# Patient Record
Sex: Male | Born: 1950
Health system: Southern US, Community
[De-identification: ages and names within clinical notes are randomized; demographics above are authoritative.]

## PROBLEM LIST (undated history)

## (undated) DIAGNOSIS — I700 Atherosclerosis of aorta: Secondary | ICD-10-CM

## (undated) DIAGNOSIS — I4719 Other supraventricular tachycardia: Secondary | ICD-10-CM

## (undated) DIAGNOSIS — I471 Supraventricular tachycardia: Secondary | ICD-10-CM

## (undated) HISTORY — PX: BACK SURGERY: SHX140

## (undated) HISTORY — DX: Other supraventricular tachycardia: I47.19

## (undated) HISTORY — PX: KNEE SURGERY: SHX244

## (undated) HISTORY — DX: Atherosclerosis of aorta: I70.0

## (undated) HISTORY — DX: Supraventricular tachycardia: I47.1

---

## 2003-11-16 ENCOUNTER — Ambulatory Visit (HOSPITAL_COMMUNITY): Admission: RE | Admit: 2003-11-16 | Discharge: 2003-11-16 | Payer: Self-pay | Admitting: Orthopaedic Surgery

## 2003-11-30 ENCOUNTER — Ambulatory Visit (HOSPITAL_COMMUNITY): Admission: RE | Admit: 2003-11-30 | Discharge: 2003-11-30 | Payer: Self-pay | Admitting: Orthopaedic Surgery

## 2004-03-14 ENCOUNTER — Ambulatory Visit (HOSPITAL_COMMUNITY): Admission: RE | Admit: 2004-03-14 | Discharge: 2004-03-14 | Payer: Self-pay | Admitting: Orthopaedic Surgery

## 2004-03-22 ENCOUNTER — Ambulatory Visit (HOSPITAL_COMMUNITY): Admission: RE | Admit: 2004-03-22 | Discharge: 2004-03-23 | Payer: Self-pay | Admitting: Neurosurgery

## 2007-03-07 ENCOUNTER — Ambulatory Visit (HOSPITAL_COMMUNITY): Admission: RE | Admit: 2007-03-07 | Discharge: 2007-03-07 | Payer: Self-pay | Admitting: Orthopaedic Surgery

## 2007-03-18 ENCOUNTER — Ambulatory Visit (HOSPITAL_COMMUNITY): Admission: RE | Admit: 2007-03-18 | Discharge: 2007-03-18 | Payer: Self-pay | Admitting: Orthopaedic Surgery

## 2011-03-06 NOTE — H&P (Signed)
NAME:  Brandon Allison, CRUM              ACCOUNT NO.:  [id number]   MEDICAL RECORD NO.:  [id number]          PATIENT TYPE:  AMB   LOCATION:  DAY                           FACILITY:  [facility]   PHYSICIAN:  J. Darreld Mclean, M.D. DATE OF BIRTH:  [birth year]   DATE OF ADMISSION:  03/18/2007  DATE OF DISCHARGE:  LH                              HISTORY & PHYSICAL   CHIEF COMPLAINT:  Right knee pain.   The patient is 60 years old and complains of pain and tenderness in his  right knee.  The pain and tenderness for several weeks are getting  progressively worse.  It is giving way, swelling and occasional feeling  that it is going to lock but it has not locked up.  He says the knee  just does not feel right.  I did a left knee arthroscopy on him in  February 2005, and he has done well since then.  He says his right knee  feels like the left knee did before he had surgery.  I saw him in the  office on May 15.  I thought he had a tear of the medial meniscus,  perhaps lateral meniscus.  He underwent an MRI of the knee on May 16 at  the hospital.  MRI shows tear of the anterior horn and body of the  lateral meniscus associated with a lateral parameniscal cyst.  He also  has a peripheral irregular tear of the posterior horn of the medial  meniscus with centrally displaced medial meniscal fragment. There are no  ligamentous injuries.  He has a joint effusion.   I discussed with the patient the findings, and I saw him back in the  office on May 23.  He wants to go ahead and have surgery on the knee as  soon as possible.  Tuesday, May 27, is the first available date.  He  wants to go ahead and take that.  He understands the procedure having  had an arthroscopy on the left knee.  I did go over risks and  imponderables with him.   PAST HISTORY:  Positive for back pain.  He has no allergies.  He is  currently taking Zetia 10 mg daily, aspirin 81 mg daily and vitamin C.  He does not smoke or use alcohol.   PREVIOUS SURGERIES:  1. Left knee arthroscopy February 2005.  2. Back surgery 2005 in June.   Dr. Charm Barges is his family doctor.  He lives in [place], and he is  married.   PHYSICAL EXAMINATION:  Blood pressure is 126/78, pulse 78, respirations  16, afebrile, 5 feet 4 3/4 inches, 153 pounds.  He is alert and  cooperative.  HEENT:  Negative.  Neck:  Supple.  Lungs:  Clear to  percussion and auscultation.  Heart:  Regular without murmur heard.  Abdomen:  Soft, nontender without masses.  His right knee is slightly  effused.  He has got pain and tenderness in the medial joint line and  positive McMurray medially, weakly laterally.  Drawer sign is negative.  Lachman negative.  Extremities:  Normal limits.  Central  nervous system:  Intact.  Skin:  Intact.   IMPRESSION:  1. Tear of the medial meniscus of the right knee and a tear in the      anterior horn and body of the lateral meniscus, right knee.  2. Status post left knee arthroscopy and status post lower back      surgery.   PLAN:  Operative for arthroscopy, medial and lateral meniscectomy,  partial.  I discussed with the patient the plan and procedure, risk and  imponderables.  His labs are pending.                                            ______________________________  J. Darreld Mclean, M.D.     JWK/MEDQ  D:  03/17/2007  T:  03/17/2007  Job:  [id number]

## 2011-03-06 NOTE — Op Note (Signed)
NAME:  Brandon Allison, Brandon Allison              ACCOUNT NO.:  [id number]   MEDICAL RECORD NO.:  [id number]          PATIENT TYPE:  AMB   LOCATION:  DAY                           FACILITY:  [facility]   PHYSICIAN:  J. Darreld Mclean, M.D. DATE OF BIRTH:  Feb 25, 1951   DATE OF PROCEDURE:  DATE OF DISCHARGE:                               OPERATIVE REPORT   PREOPERATIVE DIAGNOSIS:  Tear medial and lateral meniscus of the right  knee.   POSTOPERATIVE DIAGNOSIS:  Tear medial and lateral meniscus of the right  knee.   OPERATION:  1. Operative arthroscopy of the right knee.  2. Partial medial and lateral meniscectomies.  3. Holmium laser.   ANESTHESIA:  General.   TOURNIQUET TIME:  Please refer to anesthesia record.   DRAINS:  No drains.   JUSTIFICATION FOR PROCEDURE:  The patient complains of pain and  tenderness of his right knee, swelling, giving away, and a couple of  episodes of locking.  He had a previous arthroscopy of his left knee  several years ago.  He says the right knee feels now like the left knee  did before his surgery.  An MRI of the knee was done, which showed a  tear of the medial and lateral meniscus of the knee.  The ligaments were  intact.  Surgery was recommended.  The patient understands the risks and  imponderables of the procedure.  I have discussed these with him plus he  has undergone the procedure on the other side several years ago.   DESCRIPTION OF PROCEDURE:  The patient was seen in the holding area.  The right knee was then advised correct surgical site.  I placed a mark  on the right knee.   The patient was brought back to the operating room and placed supine on  the operating room table.  He was given general anesthesia.  The  tourniquet was placed deflated on the right upper thigh, and a leg  holder was placed on the right upper thigh.  He was prepped and draped  in the usual manner.  We had a timeout.  We identified the above-  mentioned patient as the patient  and the right knee as the correct  surgical site.  His leg was elevated and wrapped circumferentially with  an Esmarch bandage.  The tourniquet was inflated to 250 mmHg.  The  Esmarch bandage was removed.  Inflow cannula was inserted medially.  Lactated Ringer's filled the knee by an infusion pump.   Arthroscope was inserted laterally, and the knee was systematically  examined.  The suprapatellar pouch looked negative.  Medially, there was  a tear on the posterior horn of the medial meniscus with a defect in the  articular surface of the femoral condyle.  The anterior cruciate was  intact.  Laterally, there was a small tear of the anterior horn and  slightly going around the corner to the posterior horn.  The articular  surfaces had grade 2 changes.  No loose bodies were present.   Using the meniscal punch, meniscal shaver and the laser, a good smooth  contour was  obtained.  The largest segment of the cartilage was removed  manually after cutting with the laser.  A good smooth contour was  obtained.  The defect on the femoral condyle had the laser used over  this.  Laterally, the meniscus had a small tear anterior, and this was  removed with the meniscal shaver, and then the small tear on the lateral  edge of the posterior horn was debrided with the holmium laser.  A good  smooth contour was obtained.  Pictures were taken throughout the case.  The knee was systematically reexamined, and no pathology was found.  The  wound was irrigated with the remaining part of the lactated Ringer's.  The wound was reapproximated with 3-0 nylon interrupted vertical  mattress manner.  Marcaine 0.25% was instilled in each portal.  The  tourniquet was deflated.  Please refer to the anesthesia record for the  tourniquet time.  A sterile dressing was applied.  A bulky dressing was  applied.  A knee immobilizer was applied.  The patient tolerated the  procedure well.   PLAN:  I will see him in the office in  approximately 10 days to two  weeks.  The patient has been arranged.  A prescription was given for  Vicodin ES for pain.  If there is any difficulty, he is to contact me  through the office or hospital beeper system.  The numbers have been  provided.           ______________________________  Shela Commons. Darreld Mclean, M.D.     JWK/MEDQ  D:  03/18/2007  T:  03/18/2007  Job:  [id number]

## 2011-03-09 NOTE — Op Note (Signed)
NAME:  Brandon Allison, Brandon Allison                        ACCOUNT NO.:  [id number]   MEDICAL RECORD NO.:  [id number]                   PATIENT TYPE:  AMB   LOCATION:  DAY                                  FACILITY:  [facility]   PHYSICIAN:  J. Darreld Mclean, M.D.              DATE OF BIRTH:  [birth year]   DATE OF PROCEDURE:  DATE OF DISCHARGE:                                 OPERATIVE REPORT   PREOPERATIVE DIAGNOSES:  Left knee bucket handle tear medially of the  meniscus and small tear and fraying of the lateral meniscus.   POSTOPERATIVE DIAGNOSES:  Left knee bucket handle tear medially of the  meniscus and small tear and fraying of the lateral meniscus.   PROCEDURE:  Operative arthroscopy left knee, removal of bucket handle tear  partial medial meniscectomy, debridement and fraying of the lateral  meniscus.   ANESTHESIA:  General.   TOURNIQUET TIME:  18 minutes.   DRAINS:  None.   SURGEON:  J. Darreld Mclean, M.D.   ASSISTANT:  Candace Cruise, P.A.   INDICATIONS FOR PROCEDURE:  The patient is a 60 year old male with pain and  tenderness in the knees since the first of this new year.  MRI shows a  bucket handle tear of the left knee medially and a small tear of the  anterior meniscus laterally. The patient has not improved with conservative  treatment, surgery is recommended.   FINDINGS:  The suprapatellar pouch looked normal and the suprapatellar  looked normal laterally. There was a small fraying ear anteriorly. The  anterior cruciate was intact, medially there was a large bucket handle tear  with the bucket handle portion moved forward. Some slight grade 2-3 changes  medially on the surface.  No loose bodies.   DESCRIPTION OF PROCEDURE:  The patient was placed supine on the operating  room table and general anesthesia was obtained.  Leg holder placed and  tourniquet placed in the left upper thigh. The patient prepped and draped in  the usual manner. We ascertained we were doing Mr.  Allison, doing his left  knee. The tourniquet was used but first we used an Esmarch bandage to  exsanguinate the extremity, tourniquet inflated to 300 mmHg.  Esmarch  bandage removed.  Inflow cannula inserted medially, lactated Ringer's  instilled in the knee by an infusion pump.  Arthroscope inserted laterally,  knee systematically examined, please see findings above. Permanent pictures  taken throughout the case.  The medial meniscus was approached first using a  hook knife and then the scissors.  A large portion of the bucket handle tear  was removed and the remaining portion was removed and submitted with a  meniscal shaver and a meniscal punch.  Good contour was obtained. Laterally  using a small meniscal shaver, the frayed portion of the torn and the  anterior portion of the lateral meniscus was debrided and removed and the  other frayed portion was submitted  back to a clean smooth edge.  The knee  was systematically reexamined and no pathology found.  The wound was  irrigated with the remaining part of the lactated Ringer's, 3-0 nylon used  to close each wound in interrupted vertical mattress manner.  Marcaine 0.25%  used in each portal, tourniquet deflated after 18 minutes, sterile dressing  applied, bulky dressing applied, knee immobilizer applied. The patient went  to recovery in good condition. He will be seen in the office for physical  therapy later this week and I will see him in approximately 10 days to 2  weeks for suture removal.  If there are any difficulties contact me through  the office or hospital beeper system.  Vicodin ES given for pain.      ___________________________________________                                            Teola Bradley, M.D.   JWK/MEDQ  D:  11/30/2003  T:  11/30/2003  Job:  [id number]

## 2011-03-09 NOTE — H&P (Signed)
NAME:  Brandon Allison, Brandon Allison                        ACCOUNT NO.:  [id number]   MEDICAL RECORD NO.:  [id number]                   PATIENT TYPE:  AMB   LOCATION:  DAY                                  FACILITY:  [facility]   PHYSICIAN:  J. Darreld Mclean, M.D.              DATE OF BIRTH:  04-14-1951   DATE OF ADMISSION:  DATE OF DISCHARGE:                                HISTORY & PHYSICAL   CHIEF COMPLAINT:  1. Left-knee pain.  2. Torn meniscus.   HISTORY OF PRESENT ILLNESS:  The patient is a 60 year old male with pain and  tenderness in his left knee.  He started having pain around the first of the  year.  He denies any falls or trauma.  It has gotten progressively worse.  I  saw him in the office January 18, and recommended MRI.  MRI was done at the  hospital on January 25 and showed horizontal tear anterior horn and lateral  meniscus with large bucket-handle tear of the medial meniscus.  The  patient's knee started giving away, having pain, swelling and tenderness.  He has been on antiinflammatory Naprosyn without any help.  The patient  desires surgery at this time.   The risk and imponderables have been discussed with the patient who appears  to understand and agrees to the procedure as stated.   PAST MEDICAL HISTORY:  The patient has had problems with his back in the  past.  Otherwise negative review of systems except for a torn Achilles  tendon.  He has had a vasectomy and a circumcision.   CURRENT MEDICATIONS:  1. Lipitor 10 mg once a day.  2. Aspirin 81 mg a day.   SOCIAL HISTORY:  He does not use alcoholic beverage, and he does not smoke.  He is a Engineer, agricultural.  The patient is married and lives in  [place].   FAMILY PHYSICIAN:  Dr. Charm Barges is his family doctor.   PHYSICAL EXAMINATION:  VITAL SIGNS:  The patient is 5 feet, 6 inches and  weighs 162 pounds.  Blood pressure is 120/72, pulse 60, respirations 12,  afebrile.  HEENT:  Negative.  He is alert, cooperative and  oriented.  NECK:  Supple.  LUNGS:  Clear to percussion and auscultation.  HEART:  Regular without murmur heard.  ABDOMEN:  Soft, nontender without masses.  EXTREMITIES:  The left knee has got effusion.  Other extremities are within  normal limits.  SKIN:  Intact.   IMPRESSION:  1. Tear of lateral meniscus, left knee, with bucket handle tear medially.  2. Tear anterior portion of the lateral meniscus.   PLAN:  Operative arthroscopy wrist and imponderables were discussed with the  patient preoperative, and he appears to understand and agrees with the  procedure.  Labs are pending.     ___________________________________________  Teola Bradley, M.D.   JWK/MEDQ  D:  11/30/2003  T:  11/30/2003  Job:  [id number]

## 2011-03-09 NOTE — Op Note (Signed)
NAME:  Brandon Allison, Brandon Allison NO.:  [id number]   MEDICAL RECORD NO.:  [id number]                   PATIENT TYPE:  OIB   LOCATION:  3001                                 FACILITY:  [facility]   PHYSICIAN:  Coletta Memos, M.D.                  DATE OF BIRTH:  Jul 21, 1951   DATE OF PROCEDURE:  03/22/2004  DATE OF DISCHARGE:                                 OPERATIVE REPORT   PREOPERATIVE DIAGNOSIS:  1. Displaced disc L4-L5, left.  2. Left L5 radiculopathy.   POSTOPERATIVE DIAGNOSIS:  1. Displaced disc L4-L5, left.  2. Left L5 radiculopathy.   PROCEDURE:  Left L4-L5 semi-hemilaminectomy and discectomy with  microdissection.   COMPLICATIONS:  None.   SURGEON:  Coletta Memos, M.D.   ANESTHESIA:  General endotracheal anesthesia.   INDICATIONS FOR PROCEDURE:  Jamas Jaquay presented with severe pain in the  left lower extremity he has had now for a number of weeks.  The pain did not  resolve.  He has an MRI which showed a large disc herniation at L4-L5 with a  piece of disc that had migrated caudally.  I, therefore, recommended and he  agreed to undergo operative decompression.   OPERATIVE NOTE:  Mr. Rochford was brought to the operating room, intubated,  and placed under general anesthesia without difficulty.  He was rolled prone  onto a Wilson frame and all pressure points were properly padded.  His skin  was prepped and he was draped in a sterile fashion.  I infiltrated 10 mL of  0.5% lidocaine with 1:200,000 epinephrine into the lumbar region.  I then  opened the skin with a #10 blade and took this down to the thoracolumbar  fascia.  I exposed the lamina of L3, L4, and L5.  I took an x-ray and this  showed that I was at the L3-L4 interlaminar space, so I then move down one  level.  I used Kerrison punches and created a semi-hemilaminectomy.  Once  that was done, I removed the thecal sac and removed the ligamentum flavum,  therefore, exposing the thecal sac.   Using the microscope, I brought that  into the operative field.  I retracted the thecal sac medially and  encountered what was a large disc herniation caudal to the disc space.  I  was able to remove that without difficulty and then I opened the disc space.  I used curets and pituitary rongeurs and took out enough of the disc to  satisfy myself that there were no free pieces remaining within the disc  space and that the nerve root was completely decompressed.  I then irrigated  the wound.  I then closed the wound in layered fashion using Vicryl sutures.  Dermabond was used for a sterile dressing.  The patient tolerated the  procedure well.  Coletta Memos, M.D.    KC/MEDQ  D:  03/22/2004  T:  03/22/2004  Job:  [id number]

## 2016-11-13 ENCOUNTER — Ambulatory Visit (INDEPENDENT_AMBULATORY_CARE_PROVIDER_SITE_OTHER): Payer: BC Managed Care – PPO

## 2016-11-13 ENCOUNTER — Ambulatory Visit (INDEPENDENT_AMBULATORY_CARE_PROVIDER_SITE_OTHER): Payer: BC Managed Care – PPO | Admitting: Orthopaedic Surgery

## 2016-11-13 ENCOUNTER — Encounter: Payer: Self-pay | Admitting: Orthopaedic Surgery

## 2016-11-13 VITALS — BP 138/70 | HR 55 | Temp 97.7°F | Ht 65.0 in | Wt 158.0 lb

## 2016-11-13 DIAGNOSIS — G8929 Other chronic pain: Secondary | ICD-10-CM

## 2016-11-13 DIAGNOSIS — M5441 Lumbago with sciatica, right side: Secondary | ICD-10-CM

## 2016-11-13 MED ORDER — PREDNISONE 5 MG (21) PO TBPK: ORAL_TABLET | ORAL | 0 refills | Status: DC

## 2016-11-13 NOTE — Patient Instructions (Signed)

## 2016-11-13 NOTE — Progress Notes (Signed)
Subjective:    Patient ID: Brandon Allison, male    DOB: 09-Jul-1951, 66 y.o.   MRN: [id number]  HPI He has had pain of the right lateral thigh to the knee for over a month getting worse.  He has no problems during the day but it hurts when he sits for a while or after night and getting up from bed.  It is intense at times.  It lasts just a few seconds.  He has been taking one Naprosyn 500 daily for years.  He had back surgery in 2009 and has done well since then.  He has no numbness of the foot or lower leg.  He has no redness.  He has no trauma.   Review of Systems  HENT: Negative for congestion.   Respiratory: Negative for cough and shortness of breath.   Cardiovascular: Negative for chest pain and leg swelling.  Endocrine: Negative for cold intolerance.  Musculoskeletal: Positive for arthralgias, back pain and gait problem.  Allergic/Immunologic: Negative for environmental allergies.   History reviewed. No pertinent past medical history.  Past Surgical History:  Procedure Laterality Date  . BACK SURGERY    . KNEE SURGERY      No current outpatient prescriptions on file prior to visit.   No current facility-administered medications on file prior to visit.     Social History   Social History  . Marital status: Married    Spouse name: N/A  . Number of children: N/A  . Years of education: N/A   Occupational History  . Not on file.   Social History Main Topics  . Smoking status: Never Smoker  . Smokeless tobacco: Never Used  . Alcohol use Not on file  . Drug use: Unknown  . Sexual activity: Not on file   Other Topics Concern  . Not on file   Social History Narrative  . No narrative on file    History reviewed. No pertinent family history.  BP 138/70   Pulse (!) 55   Temp 97.7 F (36.5 C)   Ht 5\' 5"  (1.651 m)   Wt 158 lb (71.7 kg)   BMI 26.29 kg/m      Objective:   Physical Exam  Constitutional: He is oriented to person, place, and time. He appears  well-developed and well-nourished.  HENT:  Head: Normocephalic and atraumatic.  Eyes: Conjunctivae and EOM are normal. Pupils are equal, round, and reactive to light.  Neck: Normal range of motion. Neck supple.  Cardiovascular: Normal rate, regular rhythm and intact distal pulses.   Pulmonary/Chest: Effort normal.  Abdominal: Soft.  Musculoskeletal: He exhibits tenderness (He has slight tenderness of the right lower back,no spasm.  He has flexion only to 35 with pain after that.  Reflexes are normal.  SLR wealky positive right at 30 degrees.  Gait normal.  No redness.  No trochanter pain right.).  Neurological: He is alert and oriented to person, place, and time. He has normal reflexes. He displays normal reflexes. No cranial nerve deficit. He exhibits normal muscle tone. Coordination normal.  Skin: Skin is warm and dry.  Psychiatric: He has a normal mood and affect. His behavior is normal. Judgment and thought content normal.    X-rays were done of the lumbar spine and pelvis, reported separately.      Assessment & Plan:   Encounter Diagnosis  Name Primary?  . Chronic right-sided low back pain with right-sided sciatica Yes   I will give prednisone dose pack.  Hold his Naprosyn until this is finished then resume Naprosyn but go to two a day.  Back exercise sheet printed.  He may need MRI.  Call if any problem.  Precautions discussed.  Electronically Signed Darreld McleanWayne Francessca Friis, MD 1/23/20189:46 AM

## 2016-11-28 ENCOUNTER — Encounter: Payer: Self-pay | Admitting: Orthopaedic Surgery

## 2016-11-28 ENCOUNTER — Ambulatory Visit (INDEPENDENT_AMBULATORY_CARE_PROVIDER_SITE_OTHER): Payer: BC Managed Care – PPO | Admitting: Orthopaedic Surgery

## 2016-11-28 VITALS — BP 128/78 | HR 49 | Temp 97.7°F | Ht 65.0 in | Wt 158.0 lb

## 2016-11-28 DIAGNOSIS — G8929 Other chronic pain: Secondary | ICD-10-CM

## 2016-11-28 DIAGNOSIS — M5441 Lumbago with sciatica, right side: Secondary | ICD-10-CM

## 2016-11-28 NOTE — Progress Notes (Signed)
Patient Brandon Allison, male DOB:03/05/1951, 66 y.o. UXL:[id number]  Chief Complaint  Patient presents with  . Follow-up    BACK PAIN WITH RIGHT SIDED LEG PAIN    HPI  Brandon Allison is a 66 y.o. male who has had right sided sciatica.  He improved with the prednisone and is now taking the Naprosyn which helps also.  He has no new trauma.  He is walking.  He is pleased with his progress. HPI  Body mass index is 26.29 kg/m.  ROS  Review of Systems  HENT: Negative for congestion.   Respiratory: Negative for cough and shortness of breath.   Cardiovascular: Negative for chest pain and leg swelling.  Endocrine: Negative for cold intolerance.  Musculoskeletal: Positive for arthralgias, back pain and gait problem.  Allergic/Immunologic: Negative for environmental allergies.    No past medical history on file.  Past Surgical History:  Procedure Laterality Date  . BACK SURGERY    . KNEE SURGERY      No family history on file.  Social History Social History  Substance Use Topics  . Smoking status: Never Smoker  . Smokeless tobacco: Never Used  . Alcohol use Not on file    No Known Allergies  Current Outpatient Prescriptions  Medication Sig Dispense Refill  . Ascorbic Acid (VITAMIN C PO) Take by mouth.    Marland Kitchen. aspirin EC 81 MG tablet Take 81 mg by mouth daily.    . Cholecalciferol (VITAMIN D PO) Take by mouth.    . Cyanocobalamin (VITAMIN B 12 PO) Take by mouth.    . naproxen (NAPROSYN) 500 MG tablet     . predniSONE (STERAPRED UNI-PAK 21 TAB) 5 MG (21) TBPK tablet Take 6 pills first day; 5 pills second day; 4 pills third day; 3 pills fourth day; 2 pills next day and 1 pill last day. 21 tablet 0   No current facility-administered medications for this visit.      Physical Exam  Blood pressure 128/78, pulse (!) 49, temperature 97.7 F (36.5 C), height 5\' 5"  (1.651 m), weight 158 lb (71.7 kg).  Constitutional: overall normal hygiene, normal nutrition, well  developed, normal grooming, normal body habitus. Assistive device:none  Musculoskeletal: gait and station Limp none, muscle tone and strength are normal, no tremors or atrophy is present.  .  Neurological: coordination overall normal.  Deep tendon reflex/nerve stretch intact.  Sensation normal.  Cranial nerves II-XII intact.   Skin:   Normal overall no scars, lesions, ulcers or rashes. No psoriasis.  Psychiatric: Alert and oriented x 3.  Recent memory intact, remote memory unclear.  Normal mood and affect. Well groomed.  Good eye contact.  Cardiovascular: overall no swelling, no varicosities, no edema bilaterally, normal temperatures of the legs and arms, no clubbing, cyanosis and good capillary refill.  Lymphatic: palpation is normal.  Spine/Pelvis examination:  Inspection:  Overall, sacoiliac joint benign and hips nontender; without crepitus or defects.   Thoracic spine inspection: Alignment normal without kyphosis present   Lumbar spine inspection:  Alignment  with normal lumbar lordosis, without scoliosis apparent.   Thoracic spine palpation:  without tenderness of spinal processes   Lumbar spine palpation: with tenderness of lumbar area; without tightness of lumbar muscles    Range of Motion:   Lumbar flexion, forward flexion is full without pain or tenderness    Lumbar extension is full without pain or tenderness   Left lateral bend is Normal  without pain or tenderness   Right lateral bend  is Normal without pain or tenderness   Straight leg raising is Normal   Strength & tone: Normal   Stability overall normal stability     The patient has been educated about the nature of the problem(s) and counseled on treatment options.  The patient appeared to understand what I have discussed and is in agreement with it.  Encounter Diagnosis  Name Primary?  . Chronic right-sided low back pain with right-sided sciatica Yes    PLAN Call if any problems.  Precautions discussed.   Continue current medications.   Return to clinic 1 month   Electronically Signed Darreld Mclean, MD 2/7/20184:12 PM

## 2017-01-02 ENCOUNTER — Encounter: Payer: Self-pay | Admitting: Orthopaedic Surgery

## 2017-01-02 ENCOUNTER — Ambulatory Visit (INDEPENDENT_AMBULATORY_CARE_PROVIDER_SITE_OTHER): Payer: BC Managed Care – PPO | Admitting: Orthopaedic Surgery

## 2017-01-02 VITALS — BP 115/70 | HR 56 | Ht 65.0 in | Wt 157.0 lb

## 2017-01-02 DIAGNOSIS — M5441 Lumbago with sciatica, right side: Secondary | ICD-10-CM | POA: Diagnosis not present

## 2017-01-02 DIAGNOSIS — G8929 Other chronic pain: Secondary | ICD-10-CM

## 2017-01-02 NOTE — Progress Notes (Signed)
Patient RU:EAVWU:Brandon Allison, male DOB:02/12/1951, 66 y.o. JWJ:[id number]  Chief Complaint  Patient presents with  . Follow-up    right leg pain    HPI  Brandon Allison is a 66 y.o. male who has had right sided sciatica. He is much better on the Naprosyn but he does not like taking any medicine.  I will have him decrease to one tablet every other day.  He is to continue his activity and exercises.  I will see him as needed. HPI  Body mass index is 26.13 kg/m.  ROS  Review of Systems  HENT: Negative for congestion.   Respiratory: Negative for cough and shortness of breath.   Cardiovascular: Negative for chest pain and leg swelling.  Endocrine: Negative for cold intolerance.  Musculoskeletal: Positive for arthralgias, back pain and gait problem.  Allergic/Immunologic: Negative for environmental allergies.    No past medical history on file.  Past Surgical History:  Procedure Laterality Date  . BACK SURGERY    . KNEE SURGERY      No family history on file.  Social History Social History  Substance Use Topics  . Smoking status: Never Smoker  . Smokeless tobacco: Never Used  . Alcohol use Not on file    No Known Allergies  Current Outpatient Prescriptions  Medication Sig Dispense Refill  . Ascorbic Acid (VITAMIN C PO) Take by mouth.    Marland Kitchen. aspirin EC 81 MG tablet Take 81 mg by mouth daily.    . Cholecalciferol (VITAMIN D PO) Take by mouth.    . Cyanocobalamin (VITAMIN B 12 PO) Take by mouth.    . naproxen (NAPROSYN) 500 MG tablet     . predniSONE (STERAPRED UNI-PAK 21 TAB) 5 MG (21) TBPK tablet Take 6 pills first day; 5 pills second day; 4 pills third day; 3 pills fourth day; 2 pills next day and 1 pill last day. 21 tablet 0   No current facility-administered medications for this visit.      Physical Exam  Blood pressure 115/70, pulse (!) 56, height 5\' 5"  (1.651 m), weight 157 lb (71.2 kg).  Constitutional: overall normal hygiene, normal nutrition, well  developed, normal grooming, normal body habitus. Assistive device:none  Musculoskeletal: gait and station Limp none, muscle tone and strength are normal, no tremors or atrophy is present.  .  Neurological: coordination overall normal.  Deep tendon reflex/nerve stretch intact.  Sensation normal.  Cranial nerves II-XII intact.   Skin:   Normal overall no scars, lesions, ulcers or rashes. No psoriasis.  Psychiatric: Alert and oriented x 3.  Recent memory intact, remote memory unclear.  Normal mood and affect. Well groomed.  Good eye contact.  Cardiovascular: overall no swelling, no varicosities, no edema bilaterally, normal temperatures of the legs and arms, no clubbing, cyanosis and good capillary refill.  Lymphatic: palpation is normal.  He has full ROM of the back and no pain, no limp, NV intact.  The patient has been educated about the nature of the problem(s) and counseled on treatment options.  The patient appeared to understand what I have discussed and is in agreement with it.  Encounter Diagnosis  Name Primary?  . Chronic right-sided low back pain with right-sided sciatica Yes    PLAN Call if any problems.  Precautions discussed.  Continue current medications.   Return to clinic PRN   Electronically Signed Darreld McleanWayne Jameyah Fennewald, MD 3/14/20183:58 PM

## 2018-05-05 DIAGNOSIS — D1721 Benign lipomatous neoplasm of skin and subcutaneous tissue of right arm: Secondary | ICD-10-CM | POA: Diagnosis not present

## 2018-05-08 DIAGNOSIS — D485 Neoplasm of uncertain behavior of skin: Secondary | ICD-10-CM | POA: Diagnosis not present

## 2018-05-08 DIAGNOSIS — D1722 Benign lipomatous neoplasm of skin and subcutaneous tissue of left arm: Secondary | ICD-10-CM | POA: Diagnosis not present

## 2018-10-29 DIAGNOSIS — H2513 Age-related nuclear cataract, bilateral: Secondary | ICD-10-CM | POA: Diagnosis not present

## 2018-12-26 DIAGNOSIS — H524 Presbyopia: Secondary | ICD-10-CM | POA: Diagnosis not present

## 2019-04-13 DIAGNOSIS — Z0120 Encounter for dental examination and cleaning without abnormal findings: Secondary | ICD-10-CM | POA: Diagnosis not present

## 2019-05-14 DIAGNOSIS — G4733 Obstructive sleep apnea (adult) (pediatric): Secondary | ICD-10-CM | POA: Diagnosis not present

## 2019-05-14 DIAGNOSIS — M1990 Unspecified osteoarthritis, unspecified site: Secondary | ICD-10-CM | POA: Diagnosis not present

## 2019-05-14 DIAGNOSIS — E785 Hyperlipidemia, unspecified: Secondary | ICD-10-CM | POA: Diagnosis not present

## 2019-05-14 DIAGNOSIS — Z125 Encounter for screening for malignant neoplasm of prostate: Secondary | ICD-10-CM | POA: Diagnosis not present

## 2019-10-29 DIAGNOSIS — E785 Hyperlipidemia, unspecified: Secondary | ICD-10-CM | POA: Diagnosis not present

## 2019-10-29 DIAGNOSIS — M1990 Unspecified osteoarthritis, unspecified site: Secondary | ICD-10-CM | POA: Diagnosis not present

## 2019-11-05 DIAGNOSIS — Z0120 Encounter for dental examination and cleaning without abnormal findings: Secondary | ICD-10-CM | POA: Diagnosis not present

## 2020-01-29 DIAGNOSIS — H5203 Hypermetropia, bilateral: Secondary | ICD-10-CM | POA: Diagnosis not present

## 2020-02-12 ENCOUNTER — Ambulatory Visit: Payer: Medicare Other | Admitting: Physician Assistant

## 2020-03-16 ENCOUNTER — Encounter: Payer: Self-pay | Admitting: Family Medicine

## 2020-03-16 ENCOUNTER — Other Ambulatory Visit: Payer: Self-pay

## 2020-03-16 ENCOUNTER — Ambulatory Visit (INDEPENDENT_AMBULATORY_CARE_PROVIDER_SITE_OTHER): Payer: Medicare Other | Admitting: Family Medicine

## 2020-03-16 VITALS — BP 119/66 | HR 66 | Temp 98.2°F | Ht 65.0 in | Wt 156.0 lb

## 2020-03-16 DIAGNOSIS — Z7689 Persons encountering health services in other specified circumstances: Secondary | ICD-10-CM

## 2020-03-16 DIAGNOSIS — M159 Polyosteoarthritis, unspecified: Secondary | ICD-10-CM

## 2020-03-16 DIAGNOSIS — G479 Sleep disorder, unspecified: Secondary | ICD-10-CM | POA: Diagnosis not present

## 2020-03-16 DIAGNOSIS — M8949 Other hypertrophic osteoarthropathy, multiple sites: Secondary | ICD-10-CM

## 2020-03-16 NOTE — Patient Instructions (Signed)
Up to 10mg  of Melatonin at bedtime for 3-4 months.  It can take a while before it really resets your REM cycle.  We talked about Rozerem and Belsomra as possible alternatives.  See me back in January for fasting labs/ annual physical, sooner if needed.

## 2020-03-16 NOTE — Progress Notes (Signed)
Subjective: NI:OEVOJJKKX care, sleep difficulties, OA HPI: Brandon Allison is a 69 y.o. male presenting to clinic today for:  1.  Sleep difficulties Patient reports longstanding history of sleep difficulties.  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  His sleep cycle has never been the same since he retired from that.  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 He has tried melatonin OTC previously but only for about a month before discontinuing.  Reports very rare caffeine or chocolate intake.  He tries to stay pretty physically active with walking 5 to 10 miles per day.  He maintains a fairly good diet.  Not on any prescription medications except for naproxen as below  2.  Osteoarthritis Patient reports osteoarthritis.  He utilizes Naprosyn daily for control of his symptoms and reports good control of arthritic pains.  Denies any GERD, GI bleed.  He hydrates well.  History reviewed. No pertinent past medical history. Past Surgical History:  Procedure Laterality Date  . BACK SURGERY    . KNEE SURGERY     Social History   Socioeconomic History  . Marital status: Married    Spouse name: Izora Gala  . Number of children: 2  . Years of education: 33  . Highest education level: 12th grade  Occupational History  . Occupation: truck Geophysicist/field seismologist    Comment: retired  . Occupation: custodian    Comment: retired  . 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 Smoking status: Never Smoker  . Smokeless tobacco: Never Used  Substance and Sexual Activity  . Alcohol use: Yes    Comment: occasional  . Drug use: Never  . Sexual activity: Yes  Other Topics Concern  . Not on file  Social History Narrative   Works part time at [employer]   Also works part time at the [employer] lives in [place], [place]   Daughter lives in [place]   He lives locally with wife, Izora Gala   Social Determinants of Health    Financial Resource Strain:   . Difficulty of Paying Living Expenses:   Food Insecurity:   . Worried About Charity fundraiser in the Last Year:   . Arboriculturist in the Last Year:   Transportation Needs:   . Film/video editor (Medical):   Marland Kitchen Lack of Transportation (Non-Medical):   Physical Activity:   . Days of Exercise per Week:   . Minutes of Exercise per Session:   Stress:   . Feeling of Stress :   Social Connections:   . Frequency of Communication with Friends and Family:   . Frequency of Social Gatherings with Friends and Family:   . Attends Religious Services:   . Active Member of Clubs or Organizations:   . Attends Archivist Meetings:   Marland Kitchen Marital Status:   Intimate Partner Violence:   . Fear of Current or Ex-Partner:   . Emotionally Abused:   Marland Kitchen Physically Abused:   . Sexually Abused:    Current Meds  Medication Sig  . Ascorbic Acid (VITAMIN C PO) Take 1,000 mg by mouth.   Marland Kitchen aspirin EC 81 MG tablet Take 81 mg by mouth daily.  . Cholecalciferol (VITAMIN D PO) Take 1,000 Units by mouth.   . Cyanocobalamin (VITAMIN B 12 PO) Take 1,000 mcg by mouth.   . naproxen (NAPROSYN) 500 MG  tablet Take 500 mg by mouth 2 (two) times daily with a meal.   . sildenafil (REVATIO) 20 MG tablet Take 20 mg by mouth. 1-5 tabs daily as needed   Family History  Problem Relation Age of Onset  . Heart failure Mother   . Hyperlipidemia Mother   . Cancer Father        lung   No Known Allergies   Health Maintenance: UTD  ROS: Per HPI  Objective: Office vital signs reviewed. BP 119/66   Pulse 66   Temp 98.2 F (36.8 C)   Ht 5\' 5"  (1.651 m)   Wt 156 lb (70.8 kg)   SpO2 97%   BMI 25.96 kg/m   Physical Examination:  General: Awake, alert, well nourished, appears to be fit.  No acute distress HEENT: Normal, sclera white.  No carotid bruits Cardio: regular rate and rhythm, S1S2 heard, no murmurs appreciated Pulm: clear to auscultation bilaterally, no wheezes,  rhonchi or rales; normal work of breathing on room air Extremities: warm, well perfused, No edema, cyanosis or clubbing; +2 pulses bilaterally MSK: normal gait and station; no gross joint deformities, swelling or erythema Skin: dry; intact; no rashes or lesions Neuro: No focal neurologic deficits Psych: Mood stable, speech normal, affect appropriate Depression screen PHQ 2/9 03/16/2020  Decreased Interest 0  Down, Depressed, Hopeless 0  PHQ - 2 Score 0    Assessment/ Plan: 69 y.o. male   1. Primary osteoarthritis involving multiple joints Well-controlled with Naprosyn.  No refills needed  2. Sleep difficulties We discussed sleep hygiene, handout provided.  I also reinforced use of the melatonin for at least 3 to 4 months before discontinuing.  Max dose 10 mg nightly.  More consistent discussed alternatives including Belsomra and Raza REM.  He does not really want to be on any prescription medications and so I think this is reasonable just to use OTC supplement  3. Establishing care with new doctor, encounter for Records reviewed.  Not due for fasting labs until January.  Annual wellness visit was scheduled and he is aware this will be over the telephone.  Daytime provided.   February, DO [facility] [phone]

## 2020-05-02 ENCOUNTER — Ambulatory Visit: Payer: Medicare Other

## 2020-05-05 ENCOUNTER — Ambulatory Visit: Payer: Medicare Other

## 2020-05-06 ENCOUNTER — Other Ambulatory Visit: Payer: Self-pay | Admitting: Family Medicine

## 2020-05-06 MED ORDER — NAPROXEN 500 MG PO TABS: 500.0000 mg | ORAL_TABLET | Freq: Two times a day (BID) | ORAL | 3 refills | Status: DC

## 2020-05-06 NOTE — Telephone Encounter (Signed)
  Prescription Request  05/06/2020  What is the name of the medication or equipment? naprosynn  Have you contacted your pharmacy to request a refill? (if applicable) no  Which pharmacy would you like this sent to? [facility]    Patient notified that their request is being sent to the clinical staff for review and that they should receive a response within 2 business days.

## 2020-05-20 ENCOUNTER — Other Ambulatory Visit: Payer: Self-pay

## 2020-05-20 ENCOUNTER — Ambulatory Visit (INDEPENDENT_AMBULATORY_CARE_PROVIDER_SITE_OTHER): Payer: Medicare Other

## 2020-05-20 DIAGNOSIS — Z0000 Encounter for general adult medical examination without abnormal findings: Secondary | ICD-10-CM | POA: Diagnosis not present

## 2020-05-20 MED ORDER — NAPROXEN 500 MG PO TABS: 500.0000 mg | ORAL_TABLET | Freq: Two times a day (BID) | ORAL | 1 refills | Status: DC

## 2020-05-20 NOTE — Progress Notes (Signed)
MEDICARE ANNUAL WELLNESS VISIT  05/20/2020  Telephone Visit Disclaimer This Medicare AWV was conducted by telephone due to national recommendations for restrictions regarding the COVID-19 Pandemic (e.g. social distancing).  I verified, using two identifiers, that I am speaking with Brandon Allison or their authorized healthcare agent. I discussed the limitations, risks, security, and privacy concerns of performing an evaluation and management service by telephone and the potential availability of an in-person appointment in the future. The patient expressed understanding and agreed to proceed.   Subjective:  Brandon Allison is a 69 y.o. male patient of Brandon Ip, DO who had a Medicare Annual Wellness Visit today via telephone. Brandon Allison is Retired but is still working part time at [employer] and lives with their spouse. He has two children. He reports that he is socially active and does interact with friends/family regularly. He is moderately physically active and enjoys reading and fishing in his spare time.  Patient Care Team: Brandon Ip, DO as PCP - General (Family Medicine)  Advanced Directives 05/20/2020  Does Patient Have a Medical Advance Directive? No  Would patient like information on creating a medical advance directive? No - Patient declined    Hospital Utilization Over the Past 12 Months: # of hospitalizations or ER visits: 0 # of surgeries: 0  Review of Systems    Patient reports that his overall health is unchanged compared to last year.  History obtained from chart review and the patient  Patient Reported Readings (BP, Pulse, CBG, Weight, etc) none  Pain Assessment Pain : No/denies pain     Current Medications & Allergies (verified) Allergies as of 05/20/2020   No Known Allergies     Medication List       Accurate as of May 20, 2020  9:40 AM. If you have any questions, ask your nurse or doctor.        aspirin EC 81 MG  tablet Take 81 mg by mouth daily.   ferrous sulfate 325 (65 FE) MG tablet Take 325 mg by mouth daily with breakfast. Twice a week   naproxen 500 MG tablet Commonly known as: NAPROSYN Take 1 tablet (500 mg total) by mouth 2 (two) times daily with a meal.   sildenafil 20 MG tablet Commonly known as: REVATIO Take 20 mg by mouth. 1-5 tabs daily as needed   VITAMIN B 12 PO Take 1,000 mcg by mouth.   VITAMIN C PO Take 1,000 mg by mouth.   VITAMIN D PO Take 1,000 Units by mouth.       History (reviewed): History reviewed. No pertinent past medical history. Past Surgical History:  Procedure Laterality Date  . BACK SURGERY    . KNEE SURGERY     Family History  Problem Relation Age of Onset  . Heart failure Mother   . Hyperlipidemia Mother   . Cancer Father        lung   Social History   Socioeconomic History  . Marital status: Married    Spouse name: Brandon Allison  . Number of children: 2  . Years of education: 39  . Highest education level: 12th grade  Occupational History  . Occupation: truck Hospital doctor    Comment: retired  . Occupation: custodian    Comment: retired  . 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 Smoking status: Never Smoker  . Smokeless tobacco: Never Used  Substance and Sexual Activity  . Alcohol use:  Yes    Comment: occasional  . Drug use: Never  . Sexual activity: Yes  Other Topics Concern  . Not on file  Social History Narrative   Works part time at [employer]   Also works part time at the [employer] lives in [place], [place]   Daughter lives in [place]   He lives locally with wife, Brandon Allison   Social Determinants of Health   Financial Resource Strain:   . Difficulty of Paying Living Expenses:   Food Insecurity:   . Worried About Programme researcher, broadcasting/film/video in the Last Year:   . Barista in the Last Year:   Transportation Needs:   . Freight forwarder (Medical):   Marland Kitchen Lack of Transportation (Non-Medical):    Physical Activity:   . Days of Exercise per Week:   . Minutes of Exercise per Session:   Stress:   . Feeling of Stress :   Social Connections:   . Frequency of Communication with Friends and Family:   . Frequency of Social Gatherings with Friends and Family:   . Attends Religious Services:   . Active Member of Clubs or Organizations:   . Attends Banker Meetings:   Marland Kitchen Marital Status:     Activities of Daily Living In your present state of health, do you have any difficulty performing the following activities: 05/20/2020  Hearing? N  Vision? N  Difficulty concentrating or making decisions? N  Walking or climbing stairs? N  Dressing or bathing? N  Doing errands, shopping? N  Preparing Food and eating ? N  Using the Toilet? N  In the past six months, have you accidently leaked urine? N  Do you have problems with loss of bowel control? N  Managing your Medications? N  Managing your Finances? N  Housekeeping or managing your Housekeeping? N  Some recent data might be hidden    Patient Education/ Literacy How often do you need to have someone help you when you read instructions, pamphlets, or other written materials from your doctor or pharmacy?: 1 - Never What is the last grade level you completed in school?: 12th grade  Exercise Current Exercise Habits: Home exercise routine, Type of exercise: walking, Time (Minutes): 40, Frequency (Times/Week): 7, Weekly Exercise (Minutes/Week): 280, Intensity: Moderate, Exercise limited by: None identified  Diet Patient reports consuming 2 meals a day and 2 snack(s) a day Patient reports that his primary diet is: Regular Patient reports that he does have regular access to food.   Depression Screen PHQ 2/9 Scores 03/16/2020  PHQ - 2 Score 0     Fall Risk Fall Risk  05/20/2020 03/16/2020  Falls in the past year? 0 0  Follow up Falls evaluation completed -     Objective:  Brandon Allison seemed alert and oriented and he  participated appropriately during our telephone visit.  Blood Pressure Weight BMI  BP Readings from Last 3 Encounters:  03/16/20 119/66  01/02/17 115/70  11/28/16 128/78   Wt Readings from Last 3 Encounters:  03/16/20 156 lb (70.8 kg)  01/02/17 157 lb (71.2 kg)  11/28/16 158 lb (71.7 kg)   BMI Readings from Last 1 Encounters:  03/16/20 25.96 kg/m    *Unable to obtain current vital signs, weight, and BMI due to telephone visit type  Hearing/Vision  . Brandon Allison did not seem to have difficulty with hearing/understanding during the telephone conversation . Reports that he has had a formal eye  exam by an eye care professional within the past year . Reports that he has not had a formal hearing evaluation within the past year *Unable to fully assess hearing and vision during telephone visit type  Cognitive Function: 6CIT Screen 05/20/2020  What Year? 0 points  What month? 0 points  What time? 0 points  Count back from 20 0 points  Months in reverse 0 points  Repeat phrase 0 points  Total Score 0   (Normal:0-7, Significant for Dysfunction: >8)  Normal Cognitive Function Screening: Yes   Immunization & Health Maintenance Record Immunization History  Administered Date(s) Administered  . Moderna SARS-COVID-2 Vaccination 10/31/2019, 11/28/2019    Health Maintenance  Topic Date Due  . TETANUS/TDAP  03/16/2021 (Originally 07/30/1970)  . PNA vac Low Risk Adult (1 of 2 - PCV13) 03/16/2021 (Originally 07/29/2017)  . INFLUENZA VACCINE  05/22/2020  . COLONOSCOPY  10/22/2026  . COVID-19 Vaccine  Completed  . Hepatitis C Screening  Discontinued       Assessment  This is a routine wellness examination for Brandon Allison.  Health Maintenance: Due or Overdue There are no preventive care reminders to display for this patient.  Brandon Allison does not need a referral for Community Assistance: Care Management:   no Social Work:    no Prescription Assistance:  no Nutrition/Diabetes  Education:  no   Plan:  Personalized Goals Goals Addressed            This Visit's Progress   . Patient Stated       05/20/2020 AWV Goal: Fall Prevention  . Over the next year, patient will decrease their risk for falls by: o Using assistive devices, such as a cane or walker, as needed o Identifying fall risks within their home and correcting them by: - Removing throw rugs - Adding handrails to stairs or ramps - Removing clutter and keeping a clear pathway throughout the home - Increasing light, especially at night - Adding shower handles/bars - Raising toilet seat o Identifying potential personal risk factors for falls: - Medication side effects - Incontinence/urgency - Vestibular dysfunction - Hearing loss - Musculoskeletal disorders - Neurological disorders - Orthostatic hypotension        Personalized Health Maintenance & Screening Recommendations  Pneumococcal vaccine  Influenza vaccine Td vaccine  Lung Cancer Screening Recommended: no (Low Dose CT Chest recommended if Age 68-80 years, 30 pack-year currently smoking OR have quit w/in past 15 years) Hepatitis C Screening recommended: no HIV Screening recommended: no  Advanced Directives: Written information was not prepared per patient's request.  Referrals & Orders No orders of the defined types were placed in this encounter.   Follow-up Plan . Follow-up with Brandon IpGottschalk, Ashly M, DO as planned . Schedule Pneumonia, Boostrix and Shingrix vaccine   I have personally reviewed and noted the following in the patient's chart:   . Medical and social history . Use of alcohol, tobacco or illicit drugs  . Current medications and supplements . Functional ability and status . Nutritional status . Physical activity . Advanced directives . List of other physicians . Hospitalizations, surgeries, and ER visits in previous 12 months . Vitals . Screenings to include cognitive, depression, and falls . Referrals  and appointments  In addition, I have reviewed and discussed with Brandon HammanJames T Allison certain preventive protocols, quality metrics, and best practice recommendations. A written personalized care plan for preventive services as well as general preventive health recommendations is available and can be mailed to the patient  at his request.      Mariam Dollar, LPN    11/13/4823   Per patient, after visit summary was not mailed.

## 2020-06-14 DIAGNOSIS — Z0120 Encounter for dental examination and cleaning without abnormal findings: Secondary | ICD-10-CM | POA: Diagnosis not present

## 2020-07-04 NOTE — Progress Notes (Signed)
Medicare Annual Wellness visit was conducted with:  Patient location:  Home Location of Provider:  [facility]

## 2020-08-04 ENCOUNTER — Telehealth: Payer: Self-pay

## 2020-08-13 DIAGNOSIS — R52 Pain, unspecified: Secondary | ICD-10-CM | POA: Diagnosis not present

## 2020-08-13 DIAGNOSIS — R059 Cough, unspecified: Secondary | ICD-10-CM | POA: Diagnosis not present

## 2020-08-13 DIAGNOSIS — R0989 Other specified symptoms and signs involving the circulatory and respiratory systems: Secondary | ICD-10-CM | POA: Diagnosis not present

## 2020-08-15 ENCOUNTER — Telehealth: Payer: Self-pay

## 2020-08-15 NOTE — Telephone Encounter (Signed)
Zinc, vitamin C, vitamin D have been shown to improve outcomes.  Recommend sleeping on belly if able.  Recommend getting up frequently to walk around and even using a spirometer if he has 1.  Stay hydrated.  If any worsening of symptoms, including cough, shortness of breath, fever, inability to stay hydrated, he should seek immediate medical attention.  Otherwise proceed with symptomatic control with OTC meds

## 2020-08-15 NOTE — Telephone Encounter (Signed)
Patient aware and voices understanding

## 2020-08-15 NOTE — Telephone Encounter (Signed)
Pt called stating that he tested positive for COVID and just wants advice from Dr Nadine Counts on things he can do or needs to do will having COVID.

## 2020-08-16 DIAGNOSIS — U071 COVID-19: Secondary | ICD-10-CM | POA: Insufficient documentation

## 2020-09-12 ENCOUNTER — Other Ambulatory Visit: Payer: Self-pay

## 2020-09-12 ENCOUNTER — Encounter: Payer: Self-pay | Admitting: Family Medicine

## 2020-09-12 ENCOUNTER — Ambulatory Visit (INDEPENDENT_AMBULATORY_CARE_PROVIDER_SITE_OTHER): Payer: Medicare Other | Admitting: Family Medicine

## 2020-09-12 VITALS — BP 134/62 | HR 67 | Temp 97.5°F | Ht 65.0 in | Wt 163.8 lb

## 2020-09-12 DIAGNOSIS — R0600 Dyspnea, unspecified: Secondary | ICD-10-CM | POA: Diagnosis not present

## 2020-09-12 DIAGNOSIS — R002 Palpitations: Secondary | ICD-10-CM

## 2020-09-12 DIAGNOSIS — Z23 Encounter for immunization: Secondary | ICD-10-CM | POA: Diagnosis not present

## 2020-09-12 DIAGNOSIS — R000 Tachycardia, unspecified: Secondary | ICD-10-CM

## 2020-09-12 DIAGNOSIS — R0609 Other forms of dyspnea: Secondary | ICD-10-CM

## 2020-09-12 NOTE — Patient Instructions (Signed)
You had labs performed today.  You will be contacted with the results of the labs once they are available, usually in the next 3 business days for routine lab work.  If you have an active my chart account, they will be released to your MyChart.  If you prefer to have these labs released to you via telephone, please let us know.  If you had a pap smear or biopsy performed, expect to be contacted in about 7-10 days.  Call and ask for Toni Amend if no appointment call by next Friday.   Palpitations Palpitations are feelings that your heartbeat is not normal. Your heartbeat may feel like it is:  Uneven.  Faster than normal.  Fluttering.  Skipping a beat. This is usually not a serious problem. In some cases, you may need tests to rule out any serious problems. Follow these instructions at home: Pay attention to any changes in your condition. Take these actions to help manage your symptoms: Eating and drinking  Avoid: ? Coffee, tea, soft drinks, and energy drinks. ? Chocolate. ? Alcohol. ? Diet pills. Lifestyle   Try to lower your stress. These things can help you relax: ? Yoga. ? Deep breathing and meditation. ? Exercise. ? Using words and images to create positive thoughts (guided imagery). ? Using your mind to control things in your body (biofeedback).  Do not use drugs.  Get plenty of rest and sleep. Keep a regular bed time. General instructions   Take over-the-counter and prescription medicines only as told by your doctor.  Do not use any products that contain nicotine or tobacco, such as cigarettes and e-cigarettes. If you need help quitting, ask your doctor.  Keep all follow-up visits as told by your doctor. This is important. You may need more tests if palpitations do not go away or get worse. Contact a doctor if:  Your symptoms last more than 24 hours.  Your symptoms occur more often. Get help right away if you:  Have chest pain.  Feel short of  breath.  Have a very bad headache.  Feel dizzy.  Pass out (faint). Summary  Palpitations are feelings that your heartbeat is uneven or faster than normal. It may feel like your heart is fluttering or skipping a beat.  Avoid food and drinks that may cause palpitations. These include caffeine, chocolate, and alcohol.  Try to lower your stress. Do not smoke or use drugs.  Get help right away if you faint or have chest pain, shortness of breath, a severe headache, or dizziness. This information is not intended to replace advice given to you by your health care provider. Make sure you discuss any questions you have with your health care provider. Document Revised: 11/20/2017 Document Reviewed: 11/20/2017 Elsevier Patient Education  2020 [employer].

## 2020-09-12 NOTE — Progress Notes (Signed)
Subjective: CC: Dyspnea with exertion, heart palpitations PCP: Janora Norlander, DO Brandon Allison is a 69 y.o. male presenting to clinic today for:  1.  Dyspnea with exertion, heart racing Patient reports that the symptoms have been ongoing since October.  He was sick with Covid in October and had antibody infusion done on 26 October.  Since that time he feels that he will get intermittent dyspnea with exertion.  Sometimes it will occur at rest.  He describes tachycardia during these events.  No reports of hemoptysis, night sweats, unplanned weight loss or edema.  No known family history of early cardiovascular disease or arrhythmias.  He wants a cardiology referral    ROS: Per HPI  No Known Allergies History reviewed. No pertinent past medical history.  Current Outpatient Medications:  .  Ascorbic Acid (VITAMIN C PO), Take 1,000 mg by mouth. , Disp: , Rfl:  .  aspirin EC 81 MG tablet, Take 81 mg by mouth daily., Disp: , Rfl:  .  Cholecalciferol (VITAMIN D PO), Take 1,000 Units by mouth. , Disp: , Rfl:  .  Cyanocobalamin (VITAMIN B 12 PO), Take 1,000 mcg by mouth. , Disp: , Rfl:  .  ferrous sulfate 325 (65 FE) MG tablet, Take 325 mg by mouth daily with breakfast. Twice a week, Disp: , Rfl:  .  naproxen (NAPROSYN) 500 MG tablet, Take 1 tablet (500 mg total) by mouth 2 (two) times daily with a meal., Disp: 180 tablet, Rfl: 1 .  sildenafil (REVATIO) 20 MG tablet, Take 20 mg by mouth. 1-5 tabs daily as needed, Disp: , Rfl:  Social History   Socioeconomic History  . Marital status: Married    Spouse name: Izora Gala  . Number of children: 2  . Years of education: 73  . Highest education level: 12th grade  Occupational History  . Occupation: truck Geophysicist/field seismologist    Comment: retired  . Occupation: custodian    Comment: retired  . 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 Smoking status: Never Smoker  . Smokeless tobacco: Never Used  Substance and Sexual  Activity  . Alcohol use: Yes    Comment: occasional  . Drug use: Never  . Sexual activity: Yes  Other Topics Concern  . Not on file  Social History Narrative   Works part time at [employer]   Also works part time at the [employer] lives in [place], [place]   Daughter lives in [place]   He lives locally with wife, Izora Gala   Social Determinants of Health   Financial Resource Strain:   . Difficulty of Paying Living Expenses: Not on file  Food Insecurity:   . Worried About Charity fundraiser in the Last Year: Not on file  . Ran Out of Food in the Last Year: Not on file  Transportation Needs:   . Lack of Transportation (Medical): Not on file  . Lack of Transportation (Non-Medical): Not on file  Physical Activity:   . Days of Exercise per Week: Not on file  . Minutes of Exercise per Session: Not on file  Stress:   . Feeling of Stress : Not on file  Social Connections:   . Frequency of Communication with Friends and Family: Not on file  . Frequency of Social Gatherings with Friends and Family: Not on file  . Attends Religious Services: Not on file  . Active Member of Clubs or Organizations: Not on  file  . Attends Archivist Meetings: Not on file  . Marital Status: Not on file  Intimate Partner Violence:   . Fear of Current or Ex-Partner: Not on file  . Emotionally Abused: Not on file  . Physically Abused: Not on file  . Sexually Abused: Not on file   Family History  Problem Relation Age of Onset  . Heart failure Mother   . Hyperlipidemia Mother   . Cancer Father        lung    Objective: Office vital signs reviewed. BP 134/62   Pulse 67   Temp (!) 97.5 F (36.4 C)   Ht 5' 5"  (1.651 m)   Wt 163 lb 12.8 oz (74.3 kg)   SpO2 96%   BMI 27.26 kg/m   Physical Examination:  General: Awake, alert, well nourished, No acute distress HEENT: Normal, MMM Cardio: regular rate and rhythm, S1S2 heard, no murmurs appreciated Pulm: clear to auscultation  bilaterally, no wheezes, rhonchi or rales; normal work of breathing on room air  Assessment/ Plan: 69 y.o. male   1. Racing heart beat EKG without evidence of arrhythmia or abnormality.  Check labs for possible metabolic etiology - Ambulatory referral to Cardiology - CMP14+EGFR - Thyroid Panel With TSH - CBC - Magnesium - EKG 12-Lead  2. Palpitations Referral to cardiology placed as per his request - Ambulatory referral to Cardiology - CMP14+EGFR - Thyroid Panel With TSH - CBC - Magnesium - EKG 12-Lead  3. Dyspnea on exertion Pulmonary exam was normal - Ambulatory referral to Cardiology - EKG 12-Lead  4. Need for immunization against influenza Administered - Flu Vaccine QUAD High Dose(Fluad)  5. Need for vaccination against Streptococcus pneumoniae Administered - Pneumococcal conjugate vaccine 13-valent   No orders of the defined types were placed in this encounter.  No orders of the defined types were placed in this encounter.    Janora Norlander, DO [place] [phone]

## 2020-10-04 ENCOUNTER — Ambulatory Visit: Payer: Medicare Other | Admitting: Cardiology

## 2020-10-04 ENCOUNTER — Encounter: Payer: Self-pay | Admitting: Cardiology

## 2020-10-04 ENCOUNTER — Encounter: Payer: Self-pay | Admitting: *Deleted

## 2020-10-04 ENCOUNTER — Other Ambulatory Visit: Payer: Self-pay

## 2020-10-04 VITALS — BP 148/80 | HR 60 | Ht 65.0 in | Wt 166.0 lb

## 2020-10-04 DIAGNOSIS — R002 Palpitations: Secondary | ICD-10-CM | POA: Diagnosis not present

## 2020-10-04 DIAGNOSIS — R0609 Other forms of dyspnea: Secondary | ICD-10-CM

## 2020-10-04 DIAGNOSIS — R0600 Dyspnea, unspecified: Secondary | ICD-10-CM

## 2020-10-04 NOTE — Progress Notes (Signed)
Patient ID: Brandon Allison, male   DOB: January 30, 1951, 69 y.o.   MRN: [id number] Patient enrolled for [employer] to ship a 14 day ZIO XT long term holter monitor to his home.

## 2020-10-04 NOTE — Patient Instructions (Signed)
Medication Instructions:  Your physician recommends that you continue on your current medications as directed. Please refer to the Current Medication list given to you today.  *If you need a refill on your cardiac medications before your next appointment, please call your pharmacy*  Testing/Procedures: Your physician has requested that you have an echocardiogram. Echocardiography is a painless test that uses sound waves to create images of your heart. It provides your doctor with information about the size and shape of your heart and how well your heart's chambers and valves are working. This procedure takes approximately one hour. There are no restrictions for this procedure.  A chest x-ray takes a picture of the organs and structures inside the chest, including the heart, lungs, and blood vessels. This test can show several things, including, whether the heart is enlarges; whether fluid is building up in the lungs; and whether pacemaker / defibrillator leads are still in place.  Your physician has requested that you have a coronary calcium score CT scan.   Your physician has requested that you have an exercise tolerance test. For further information please visit [email or web address] Please also follow instruction sheet, as given.  Your physician has recommended that you wear an event monitor. Event monitors are medical devices that record the heart's electrical activity. Doctors most often [place] these monitors to diagnose arrhythmias. Arrhythmias are problems with the speed or rhythm of the heartbeat. The monitor is a small, portable device. You can wear one while you do your normal daily activities. This is usually used to diagnose what is causing palpitations/syncope (passing out).   Follow-Up: At [facility], you and your health needs are our priority.  As part of our continuing mission to provide you with exceptional heart care, we have created designated Provider Care Teams.  These Care  Teams include your primary Cardiologist (physician) and Advanced Practice Providers (APPs -  Physician Assistants and Nurse Practitioners) who all work together to provide you with the care you need, when you need it.  Follow up with Dr. Mayford Knife as needed based on results of testing.    Other Instructions .ZIO XT- Long Term Monitor Instructions   Your physician has requested you wear your ZIO patch monitor_14_days.   This is a single patch monitor.  [employer] supplies one patch monitor per enrollment.  Additional stickers are not available.   Please do not apply patch if you will be having a Nuclear Stress Test, Echocardiogram, Cardiac CT, MRI, or Chest Xray during the time frame you would be wearing the monitor. The patch cannot be worn during these tests.  You cannot remove and re-apply the ZIO XT patch monitor.   Your ZIO patch monitor will be sent USPS Priority mail from [facility] directly to your home address. The monitor may also be mailed to a PO BOX if home delivery is not available.   It may take 3-5 days to receive your monitor after you have been enrolled.   Once you have received you monitor, please review enclosed instructions.  Your monitor has already been registered assigning a specific monitor serial # to you.   Applying the monitor   Shave hair from upper left chest.   Hold abrader disc by orange tab.  Rub abrader in 40 strokes over left upper chest as indicated in your monitor instructions.   Clean area with 4 enclosed alcohol pads .  Use all pads to assure are is cleaned thoroughly.  Let dry.   Apply patch as indicated in  monitor instructions.  Patch will be place under collarbone on left side of chest with arrow pointing upward.   Rub patch adhesive wings for 2 minutes.Remove white label marked "1".  Remove white label marked "2".  Rub patch adhesive wings for 2 additional minutes.   While looking in a mirror, press and release button in center of patch.  A  small green light will flash 3-4 times .  This will be your only indicator the monitor has been turned on.     Do not shower for the first 24 hours.  You may shower after the first 24 hours.   Press button if you feel a symptom. You will hear a small click.  Record Date, Time and Symptom in the Patient Log Book.   When you are ready to remove patch, follow instructions on last 2 pages of Patient Log Book.  Stick patch monitor onto last page of Patient Log Book.   Place Patient Log Book in [place] box.  Use locking tab on box and tape box closed securely.  The [employer] and [employer] has [employer] on it.  Please place in mailbox as soon as possible.  Your physician should have your test results approximately 7 days after the monitor has been mailed back to [facility].   Call [facility] Customer Care at [phone] if you have questions regarding your ZIO XT patch monitor.  Call them immediately if you see an orange light blinking on your monitor.   If your monitor falls off in less than 4 days contact our Monitor department at [phone].  If your monitor becomes loose or falls off after 4 days call [employer] at [phone] for suggestions on securing your monitor.

## 2020-10-04 NOTE — Addendum Note (Signed)
Addended by: Theresia Majors on: 10/04/2020 01:03 PM   Modules accepted: Orders

## 2020-10-04 NOTE — Addendum Note (Signed)
Addended by: Theresia Majors on: 10/04/2020 01:16 PM   Modules accepted: Orders

## 2020-10-04 NOTE — Progress Notes (Addendum)
Cardiology Consult Note    Date:  10/04/2020   ID:  Brandon Allison, DOB 1951-04-07, MRN [id number]  PCP:  Raliegh Ip, DO  Cardiologist:  Armanda Magic, MD   Chief Complaint  Patient presents with  . New Patient (Initial Visit)    DOE and palpitations    History of Present Illness:  Brandon Allison is a 69 y.o. male who is being seen today for the evaluation of DOE and palpitations at the request of Delynn Flavin M, DO.  This is a 69yo male with no prior PMH except COVID 19 infection this past October and received the antibody infusion.  Since then he had had problems with intermittent SOB.  He says that in the evenings he will be sitting and feels like his breathing is not like it was prior to COVID 19.    He also will notice his heart racing when he gets SOB. He walks 5 miles daily and has no problems with DOE or CP.  If he goes up a hill he may feel a little winded. He says that he is aware that his heart is beating faster at times when he is sitting down at night or in bed.  He denies any chest pain or pressure, PND, orthopnea, LE edema, dizziness or syncope.  He has never smoked but his mom had CHF.  EKG done in PCP office showed NSR with no ST changes.  History reviewed. No pertinent past medical history.  Past Surgical History:  Procedure Laterality Date  . BACK SURGERY    . KNEE SURGERY      Current Medications: Current Meds  Medication Sig  . Ascorbic Acid (VITAMIN C PO) Take 1,000 mg by mouth.   Marland Kitchen aspirin EC 81 MG tablet Take 81 mg by mouth daily.  . Cholecalciferol (VITAMIN D PO) Take 1,000 Units by mouth.   . Cyanocobalamin (VITAMIN B 12 PO) Take 1,000 mcg by mouth.   . ferrous sulfate 325 (65 FE) MG tablet Take 325 mg by mouth daily with breakfast. Twice a week  . naproxen (NAPROSYN) 500 MG tablet Take 1 tablet (500 mg total) by mouth 2 (two) times daily with a meal.  . sildenafil (REVATIO) 20 MG tablet Take 20 mg by mouth. 1-5 tabs daily as needed     Allergies:   Patient has no known allergies.   Social History   Socioeconomic History  . Marital status: Married    Spouse name: [name]  . Number of children: 2  . Years of education: 84  . Highest education level: 12th grade  Occupational History  . Occupation: truck Hospital doctor    Comment: retired  . Occupation: custodian    Comment: retired  . 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 Smoking status: Former Games developer  . Smokeless tobacco: Never Used  Substance and Sexual Activity  . Alcohol use: Yes    Comment: occasional  . Drug use: Never  . Sexual activity: Yes  Other Topics Concern  . Not on file  Social History Narrative   Works part time at [employer]   Also works part time at the [employer] lives in [place], [place]   Daughter lives in [place]   He lives locally with wife, [name]   Social Determinants of Health   Financial Resource Strain: Not on [employer] Insecurity: Not on file  Transportation Needs: Not on file  Physical  Activity: Not on file  Stress: Not on file  Social Connections: Not on file     Family History:  The patient's family history includes Cancer in his father; Heart failure in his mother; Hyperlipidemia in his mother.   ROS:   Please see the history of present illness.    ROS All other systems reviewed and are negative.  No flowsheet data found.     PHYSICAL EXAM:   VS:  BP (!) 148/80   Pulse 60   Ht 5\' 5"  (1.651 m)   Wt 166 lb (75.3 kg)   SpO2 98%   BMI 27.62 kg/m    GEN: Well nourished, well developed, in no acute distress  HEENT: normal  Neck: no JVD, carotid bruits, or masses Cardiac: RRR; no murmurs, rubs, or gallops,no edema.  Intact distal pulses bilaterally.  Respiratory:  clear to auscultation bilaterally, normal work of breathing GI: soft, nontender, nondistended, + BS MS: no deformity or atrophy  Skin: warm and dry, no rash Neuro:  Alert and Oriented x 3, Strength and sensation are  intact Psych: euthymic mood, full affect  Wt Readings from Last 3 Encounters:  10/04/20 166 lb (75.3 kg)  09/12/20 163 lb 12.8 oz (74.3 kg)  03/16/20 156 lb (70.8 kg)      Studies/Labs Reviewed:   EKG:  EKG is not ordered today.   Recent Labs: No results found for requested labs within last 8760 hours.   Lipid Panel No results found for: CHOL, TRIG, HDL, CHOLHDL, VLDL, LDLCALC, LDLDIRECT   Additional studies/ records that were reviewed today include:  OV notes from PCP    ASSESSMENT:    1. DOE (dyspnea on exertion)   2. Palpitations      PLAN:  In order of problems listed above:  1.  SOB -this occurs mainly at rest at night when sitting and he says that his breathing feels different from what it was prior to COVID 19 but is not exertional and can walk 5 miles without problems -seemed to start after COVID 19 infection -EKG is nonischemic from PCP -he has a remote hx of tobacco use -will check a 2D echo to make sure LVF is stable -ETT to rule out ischemia and check a coronary Ca score -check a Cxray -risks associated with stress test (MI, CVA, arrhythmia and death) reviewed with patient and he understands and wishes to proceed.  2.  Palpitations -I will get a 2 week ziopatch to assess for arrhythmias   Medication Adjustments/Labs and Tests Ordered: Current medicines are reviewed at length with the patient today.  Concerns regarding medicines are outlined above.  Medication changes, Labs and Tests ordered today are listed in the Patient Instructions below.  There are no Patient Instructions on file for this visit.   Signed, 03/18/20, MD  10/04/2020 12:49 PM    [facility] [address] [place], [place], [place]  [place] Phone: [phone]; Fax: [phone]

## 2020-10-07 ENCOUNTER — Ambulatory Visit (INDEPENDENT_AMBULATORY_CARE_PROVIDER_SITE_OTHER): Payer: Medicare Other

## 2020-10-07 DIAGNOSIS — R0600 Dyspnea, unspecified: Secondary | ICD-10-CM | POA: Diagnosis not present

## 2020-10-07 DIAGNOSIS — R0609 Other forms of dyspnea: Secondary | ICD-10-CM

## 2020-10-07 DIAGNOSIS — R002 Palpitations: Secondary | ICD-10-CM | POA: Diagnosis not present

## 2020-10-17 NOTE — Addendum Note (Signed)
Addended by: Quintella Reichert on: 10/17/2020 04:44 PM   Modules accepted: Orders

## 2020-10-29 DIAGNOSIS — R002 Palpitations: Secondary | ICD-10-CM | POA: Diagnosis not present

## 2020-11-01 ENCOUNTER — Encounter: Payer: Self-pay | Admitting: Cardiology

## 2020-11-01 DIAGNOSIS — I471 Supraventricular tachycardia: Secondary | ICD-10-CM | POA: Insufficient documentation

## 2020-11-02 ENCOUNTER — Other Ambulatory Visit: Payer: Self-pay | Admitting: *Deleted

## 2020-11-02 DIAGNOSIS — I471 Supraventricular tachycardia: Secondary | ICD-10-CM

## 2020-11-02 DIAGNOSIS — R002 Palpitations: Secondary | ICD-10-CM

## 2020-11-02 NOTE — Progress Notes (Signed)
EP referral

## 2020-11-04 ENCOUNTER — Ambulatory Visit (HOSPITAL_COMMUNITY): Payer: Medicare Other | Attending: Cardiovascular Disease

## 2020-11-04 ENCOUNTER — Other Ambulatory Visit: Payer: Self-pay

## 2020-11-04 ENCOUNTER — Encounter: Payer: Self-pay | Admitting: Cardiology

## 2020-11-04 ENCOUNTER — Other Ambulatory Visit: Payer: Self-pay | Admitting: Family Medicine

## 2020-11-04 ENCOUNTER — Ambulatory Visit (INDEPENDENT_AMBULATORY_CARE_PROVIDER_SITE_OTHER)
Admission: RE | Admit: 2020-11-04 | Discharge: 2020-11-04 | Disposition: A | Discharge status: Home / Self Care | Payer: Self-pay | Source: Ambulatory Visit | Attending: Cardiology | Admitting: Cardiology

## 2020-11-04 DIAGNOSIS — I700 Atherosclerosis of aorta: Secondary | ICD-10-CM | POA: Insufficient documentation

## 2020-11-04 DIAGNOSIS — R0609 Other forms of dyspnea: Secondary | ICD-10-CM

## 2020-11-04 DIAGNOSIS — R0600 Dyspnea, unspecified: Secondary | ICD-10-CM | POA: Diagnosis not present

## 2020-11-04 DIAGNOSIS — Z13220 Encounter for screening for lipoid disorders: Secondary | ICD-10-CM

## 2020-11-04 DIAGNOSIS — R002 Palpitations: Secondary | ICD-10-CM | POA: Diagnosis not present

## 2020-11-04 LAB — ECHOCARDIOGRAM COMPLETE
Area-P 1/2: 4.86 cm2
S' Lateral: 2.7 cm

## 2020-11-07 ENCOUNTER — Telehealth: Payer: Self-pay

## 2020-11-07 DIAGNOSIS — I700 Atherosclerosis of aorta: Secondary | ICD-10-CM

## 2020-11-07 NOTE — Telephone Encounter (Signed)
The patient has been notified of the result and verbalized understanding.  All questions (if any) were answered. Theresia Majors, RN 11/07/2020 12:00 PM  Patient has not had his cholesterol checked recently. Plan to get lab work done when he comes back 2/1 for appt and stress test.

## 2020-11-07 NOTE — Telephone Encounter (Signed)
-----   Message from Quintella Reichert, MD sent at 11/05/2020  7:04 PM EST ----- Elevated coronary Ca score which indicates you have some degree of CAD.  Await lipids from PCP

## 2020-11-15 ENCOUNTER — Encounter: Payer: Self-pay | Admitting: Cardiology

## 2020-11-15 ENCOUNTER — Ambulatory Visit: Payer: Medicare Other | Admitting: Cardiology

## 2020-11-15 ENCOUNTER — Other Ambulatory Visit: Payer: Self-pay | Admitting: Family Medicine

## 2020-11-15 ENCOUNTER — Other Ambulatory Visit: Payer: Self-pay

## 2020-11-15 VITALS — BP 112/50 | HR 51 | Ht 66.0 in | Wt 159.0 lb

## 2020-11-15 DIAGNOSIS — R002 Palpitations: Secondary | ICD-10-CM

## 2020-11-15 DIAGNOSIS — I471 Supraventricular tachycardia: Secondary | ICD-10-CM

## 2020-11-15 NOTE — Telephone Encounter (Signed)
Please have pt come in for labs first.  Need kidney function baseline.

## 2020-11-15 NOTE — Patient Instructions (Addendum)
Medication Instructions:  Your physician recommends that you continue on your current medications as directed. Please refer to the Current Medication list given to you today.  Labwork: None ordered.  Testing/Procedures: None ordered.  Follow-Up: Your physician wants you to follow-up in: AS NEEDED with Dr. Lalla Brothers.     Any Other Special Instructions Will Be Listed Below (If Applicable).  If you need a refill on your cardiac medications before your next appointment, please call your pharmacy.

## 2020-11-15 NOTE — Progress Notes (Signed)
Electrophysiology Office Note:    Date:  11/15/2020   ID:  ABDOUL ENCINAS, DOB [birth year], MRN [id number]  PCP:  Raliegh Ip, DO  [facility] Cardiologist:  Armanda Magic, MD  [facility] Electrophysiologist:  Lanier Prude, MD   Referring MD: Quintella Reichert, MD   Chief Complaint: Palpitations  History of Present Illness:    Brandon Allison is a 70 y.o. male who presents for an evaluation of palpitations at the request of Dr. Mayford Knife. Their medical history includes coronary artery calcium, aortic atherosclerosis. Brandon Allison is a very active 70 year old man who walks 5 miles daily who has noticed palpitations since he had COVID-19 in October 2021. He was treated with the antibody infusion. He tells me today that he intermittently feels palpitations in the evenings when he lays down. Sometimes it is associated with brief episodes of shortness of breath. No syncope or presyncope. No chest pain. No sustained arrhythmias.  Past Medical History:  Diagnosis Date  . Aortic atherosclerosis (HCC)   . PAT (paroxysmal atrial tachycardia) (HCC)     Past Surgical History:  Procedure Laterality Date  . BACK SURGERY    . KNEE SURGERY      Current Medications: Current Meds  Medication Sig  . Ascorbic Acid (VITAMIN C PO) Take 1,000 mg by mouth.   Marland Kitchen aspirin EC 81 MG tablet Take 81 mg by mouth daily.  . Cholecalciferol (VITAMIN D PO) Take 1,000 Units by mouth.   . Cyanocobalamin (VITAMIN B 12 PO) Take 1,000 mcg by mouth.   . ferrous sulfate 325 (65 FE) MG tablet Take 325 mg by mouth daily with breakfast. Twice a week  . naproxen (NAPROSYN) 500 MG tablet Take 1 tablet (500 mg total) by mouth 2 (two) times daily with a meal. (Patient taking differently: Take 500 mg by mouth daily in the afternoon.)  . sildenafil (REVATIO) 20 MG tablet Take 20 mg by mouth. 1-5 tabs daily as needed     Allergies:   Patient has no known allergies.   Social History   Socioeconomic History  .  Marital status: Married    Spouse name: [name]  . Number of children: 2  . Years of education: 23  . Highest education level: 12th grade  Occupational History  . Occupation: truck Hospital doctor    Comment: retired  . Occupation: custodian    Comment: retired  . 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 Smoking status: Former Games developer  . Smokeless tobacco: Never Used  Substance and Sexual Activity  . Alcohol use: Yes    Comment: occasional  . Drug use: Never  . Sexual activity: Yes  Other Topics Concern  . Not on file  Social History Narrative   Works part time at [employer]   Also works part time at the [employer] lives in [place], [place]   Daughter lives in [place]   He lives locally with wife, [name]   Social Determinants of Health   Financial Resource Strain: Not on [employer] Insecurity: Not on file  Transportation Needs: Not on file  Physical Activity: Not on file  Stress: Not on file  Social Connections: Not on file     Family History: The patient's family history includes Cancer in his father; Heart failure in his mother; Hyperlipidemia in his mother.  ROS:   Please see the history of present illness.    All other systems reviewed  and are negative.  EKGs/Labs/Other Studies Reviewed:    The following studies were reviewed today:   EKG:  The ekg ordered today demonstrates sinus rhythm with a ventricular rate of 50 bpm.  October 07, 2020 ZIO personally reviewed Brief episodes of SVT lasting up to 28 seconds. Some irregularity in the heart rhythm consistent with AT versus possible nonsustained atrial fibrillation. I am unable to clearly see P waves during these episodes of SVT.  November 04, 2020 echo Left ventricular function normal, 55% Right ventricular function normal Trivial MR   Recent Labs: No results found for requested labs within last 8760 hours.  Recent Lipid Panel No results found for: CHOL, TRIG, HDL, CHOLHDL, VLDL,  LDLCALC, LDLDIRECT  Physical Exam:    VS:  BP (!) 112/50   Pulse (!) 51   Ht 5\' 6"  (1.676 m)   Wt 159 lb (72.1 kg)   SpO2 97%   BMI 25.66 kg/m     Wt Readings from Last 3 Encounters:  11/15/20 159 lb (72.1 kg)  10/04/20 166 lb (75.3 kg)  09/12/20 163 lb 12.8 oz (74.3 kg)     GEN: Well nourished, well developed in no acute distress. Appears younger than stated age HEENT: Normal NECK: No JVD; No carotid bruits LYMPHATICS: No lymphadenopathy CARDIAC: RRR, no murmurs, rubs, gallops RESPIRATORY:  Clear to auscultation without rales, wheezing or rhonchi  ABDOMEN: Soft, non-tender, non-distended MUSCULOSKELETAL:  No edema; No deformity  SKIN: Warm and dry NEUROLOGIC:  Alert and oriented x 3 PSYCHIATRIC:  Normal affect   ASSESSMENT:    1. Atrial tachycardia (HCC)   2. Palpitations    PLAN:    In order of problems listed above:  1. PACs/atrial tachycardia Patient with palpitations since having Covid in October 2021. I suspect his arrhythmias are related to his previous COVID-19 infection with some element of myocardial inflammation. Thankfully, [name] believes his arrhythmias are improving over time. I do not see any evidence of sustained arrhythmias or sustained episodes of atrial fibrillation. Given his resting bradycardia, I do not think he would be a good candidate for beta-blockade or calcium channel blockers. I think they have higher chance of causing off target effects then significantly improving his symptoms. Given his coronary artery calcium I do not think he is a good candidate for class Ic agent such as flecainide or propafenone. For now, I would propose conservative management. He will continue to monitor for the frequency of the arrhythmias changing at home. If he notices a increase in the frequency of his arrhythmias or an episode that sustains he will reach out to the clinic to have an EKG done. We also briefly touched on using a cardia device for him to monitor  his heart rhythm at home. We discussed how there is a chance that these salvos of atrial tachycardia could precede the development of paroxysmal atrial fibrillation.  2. Coronary artery calcium  Follow-up as needed   Medication Adjustments/Labs and Tests Ordered: Current medicines are reviewed at length with the patient today.  Concerns regarding medicines are outlined above.  No orders of the defined types were placed in this encounter.  No orders of the defined types were placed in this encounter.    Signed, Alberteen Spindle, MD, [facility]  11/15/2020 12:22 PM    Electrophysiology [facility]

## 2020-11-17 ENCOUNTER — Telehealth: Payer: Self-pay

## 2020-11-17 NOTE — Telephone Encounter (Signed)
Patient has appointment next week 11/23/20, labs can be done at that time.

## 2020-11-17 NOTE — Telephone Encounter (Signed)
Pt aware he needs labwork before the refill, and lab orders are in the computer. He can come in anytime for these or he can wait till his appt on 11/23/20

## 2020-11-18 ENCOUNTER — Other Ambulatory Visit: Payer: Self-pay

## 2020-11-18 ENCOUNTER — Telehealth: Payer: Self-pay | Admitting: Cardiology

## 2020-11-18 ENCOUNTER — Other Ambulatory Visit (HOSPITAL_COMMUNITY)
Admission: RE | Admit: 2020-11-18 | Discharge: 2020-11-18 | Disposition: A | Discharge status: Home / Self Care | Payer: Medicare Other | Source: Ambulatory Visit | Attending: Cardiology | Admitting: Cardiology

## 2020-11-18 DIAGNOSIS — Z01812 Encounter for preprocedural laboratory examination: Secondary | ICD-10-CM | POA: Insufficient documentation

## 2020-11-18 DIAGNOSIS — Z20822 Contact with and (suspected) exposure to covid-19: Secondary | ICD-10-CM | POA: Insufficient documentation

## 2020-11-18 DIAGNOSIS — I700 Atherosclerosis of aorta: Secondary | ICD-10-CM

## 2020-11-18 LAB — SARS CORONAVIRUS 2 (TAT 6-24 HRS): SARS Coronavirus 2: NEGATIVE

## 2020-11-18 NOTE — Telephone Encounter (Signed)
Pt advised that it looks like Dr. Nadine Counts has already ordered a Lipid Panel on him and he is being seen Monday 1/312/22 at 8 am so he will be fasting. I advised him that the order for the ALT is in Epic and I am sure her office will draw that for him too and I will forward his message to her office for review and hopeful they will have his labs forwarded to Dr. Mayford Knife as well.

## 2020-11-18 NOTE — Telephone Encounter (Signed)
New message:    Patient calling stating that he going to see his PCP on Monday and would like to know if he can have his labs done there. Please advise.

## 2020-11-21 ENCOUNTER — Other Ambulatory Visit: Payer: Self-pay

## 2020-11-21 ENCOUNTER — Other Ambulatory Visit: Payer: Self-pay | Admitting: Family Medicine

## 2020-11-21 ENCOUNTER — Other Ambulatory Visit: Payer: Medicare Other

## 2020-11-21 DIAGNOSIS — Z125 Encounter for screening for malignant neoplasm of prostate: Secondary | ICD-10-CM | POA: Diagnosis not present

## 2020-11-21 DIAGNOSIS — R002 Palpitations: Secondary | ICD-10-CM

## 2020-11-21 DIAGNOSIS — Z13220 Encounter for screening for lipoid disorders: Secondary | ICD-10-CM | POA: Diagnosis not present

## 2020-11-21 LAB — LIPID PANEL
Chol/HDL Ratio: 3.4 ratio (ref 0.0–5.0)
Cholesterol, Total: 231 mg/dL — ABNORMAL HIGH (ref 100–199)
HDL: 68 mg/dL (ref >39)
LDL Chol Calc (NIH): 155 mg/dL — ABNORMAL HIGH (ref 0–99)
Triglycerides: 47 mg/dL (ref 0–149)
VLDL Cholesterol Cal: 8 mg/dL (ref 5–40)

## 2020-11-22 ENCOUNTER — Institutional Professional Consult (permissible substitution): Payer: Medicare Other | Admitting: Cardiology

## 2020-11-22 ENCOUNTER — Other Ambulatory Visit: Payer: Self-pay

## 2020-11-22 ENCOUNTER — Other Ambulatory Visit: Payer: Medicare Other

## 2020-11-22 ENCOUNTER — Ambulatory Visit (INDEPENDENT_AMBULATORY_CARE_PROVIDER_SITE_OTHER): Payer: Medicare Other

## 2020-11-22 DIAGNOSIS — R0609 Other forms of dyspnea: Secondary | ICD-10-CM

## 2020-11-22 DIAGNOSIS — R002 Palpitations: Secondary | ICD-10-CM

## 2020-11-22 DIAGNOSIS — R0600 Dyspnea, unspecified: Secondary | ICD-10-CM | POA: Diagnosis not present

## 2020-11-22 LAB — THYROID PANEL WITH TSH
Free Thyroxine Index: 1.5 (ref 1.2–4.9)
T3 Uptake Ratio: 25 % (ref 24–39)
T4, Total: 5.8 ug/dL (ref 4.5–12.0)
TSH: 3.11 u[IU]/mL (ref 0.450–4.500)

## 2020-11-22 LAB — CBC
Hematocrit: 43 % (ref 37.5–51.0)
Hemoglobin: 14.5 g/dL (ref 13.0–17.7)
MCH: 31.9 pg (ref 26.6–33.0)
MCHC: 33.7 g/dL (ref 31.5–35.7)
MCV: 95 fL (ref 79–97)
Platelets: 215 10*3/uL (ref 150–450)
RBC: 4.55 x10E6/uL (ref 4.14–5.80)
RDW: 11.2 % — ABNORMAL LOW (ref 11.6–15.4)
WBC: 4.9 10*3/uL (ref 3.4–10.8)

## 2020-11-22 LAB — EXERCISE TOLERANCE TEST
Estimated workload: 11.8 METS
Exercise duration (min): 10 min
Exercise duration (sec): 0 s
MPHR: 152 {beats}/min
Peak HR: 136 {beats}/min
Percent HR: 89 %
RPE: 15
Rest HR: 53 {beats}/min

## 2020-11-22 LAB — CMP14+EGFR
ALT: 16 IU/L (ref 0–44)
AST: 28 IU/L (ref 0–40)
Albumin/Globulin Ratio: 2.1 (ref 1.2–2.2)
Albumin: 4.7 g/dL (ref 3.8–4.8)
Alkaline Phosphatase: 49 IU/L (ref 44–121)
BUN/Creatinine Ratio: 29 — ABNORMAL HIGH (ref 10–24)
BUN: 25 mg/dL (ref 8–27)
Bilirubin Total: 0.5 mg/dL (ref 0.0–1.2)
CO2: 23 mmol/L (ref 20–29)
Calcium: 10 mg/dL (ref 8.6–10.2)
Chloride: 104 mmol/L (ref 96–106)
Creatinine, Ser: 0.87 mg/dL (ref 0.76–1.27)
GFR calc Af Amer: 103 mL/min/{1.73_m2} (ref >59)
GFR calc non Af Amer: 89 mL/min/{1.73_m2} (ref >59)
Globulin, Total: 2.2 g/dL (ref 1.5–4.5)
Glucose: 93 mg/dL (ref 65–99)
Potassium: 4.4 mmol/L (ref 3.5–5.2)
Sodium: 140 mmol/L (ref 134–144)
Total Protein: 6.9 g/dL (ref 6.0–8.5)

## 2020-11-22 LAB — MAGNESIUM: Magnesium: 2.1 mg/dL (ref 1.6–2.3)

## 2020-11-22 LAB — PSA: Prostate Specific Ag, Serum: 0.6 ng/mL (ref 0.0–4.0)

## 2020-11-23 ENCOUNTER — Encounter: Payer: Self-pay | Admitting: Family Medicine

## 2020-11-23 ENCOUNTER — Ambulatory Visit (INDEPENDENT_AMBULATORY_CARE_PROVIDER_SITE_OTHER): Payer: Medicare Other | Admitting: Family Medicine

## 2020-11-23 VITALS — BP 112/68 | HR 54 | Temp 97.5°F | Ht 66.0 in | Wt 159.0 lb

## 2020-11-23 DIAGNOSIS — I700 Atherosclerosis of aorta: Secondary | ICD-10-CM | POA: Diagnosis not present

## 2020-11-23 DIAGNOSIS — I471 Supraventricular tachycardia: Secondary | ICD-10-CM | POA: Diagnosis not present

## 2020-11-23 DIAGNOSIS — Z23 Encounter for immunization: Secondary | ICD-10-CM

## 2020-11-23 DIAGNOSIS — E7800 Pure hypercholesterolemia, unspecified: Secondary | ICD-10-CM

## 2020-11-23 MED ORDER — NAPROXEN 500 MG PO TABS: 500.0000 mg | ORAL_TABLET | Freq: Every day | ORAL | 3 refills | Status: DC

## 2020-11-23 MED ORDER — ROSUVASTATIN CALCIUM 20 MG PO TABS
20.0000 mg | ORAL_TABLET | Freq: Every day | ORAL | 3 refills | Status: DC
Start: 2020-11-23 — End: 2021-07-25

## 2020-11-23 NOTE — Progress Notes (Signed)
Subjective: CC: Aortic atherosclerosis, hyperlipidemia PCP: Raliegh Ip, DO [id number] T Bogden is a 70 y.o. male presenting to clinic today for:  1.  Aortic atherosclerosis, hyperlipidemia Patient's primary cardiologist is Dr. Armanda Magic  Patient had a fasting lipid panel done recently with LDL of 155.  His ASCVD risk score was 11.8%.  He has known aortic atherosclerosis.  He is here today for follow-up on these labs and medication recommendation.  His cardiologist did have time to review his labs and did recommend Crestor 20 mg daily as well   He has been on cholesterol medications in the past, specifically atorvastatin and some other 1 but he does not think it was Crestor.  Sometimes he has responded but often his cholesterol will come right back up.  He is willing to try the Crestor however.  No chest pain, shortness of breath.  No recent palpitations.  He has been evaluated by both electrophysiology and cardiology now.    The 10-year ASCVD risk score Denman George DC Montez Hageman., et al., 2013) is: 11.8%   Values used to calculate the score:     Age: 70 years     Sex: Male     Is Non-Hispanic African American: No     Diabetic: No     Tobacco smoker: No     Systolic Blood Pressure: 112 mmHg     Is BP treated: No     HDL Cholesterol: 68 mg/dL     Total Cholesterol: 231 mg/dL   ROS: Per HPI  No Known Allergies Past Medical History:  Diagnosis Date  . Aortic atherosclerosis (HCC)   . PAT (paroxysmal atrial tachycardia) (HCC)     Current Outpatient Medications:  .  Ascorbic Acid (VITAMIN C PO), Take 1,000 mg by mouth. , Disp: , Rfl:  .  aspirin EC 81 MG tablet, Take 81 mg by mouth daily., Disp: , Rfl:  .  Cholecalciferol (VITAMIN D PO), Take 1,000 Units by mouth. , Disp: , Rfl:  .  Cyanocobalamin (VITAMIN B 12 PO), Take 1,000 mcg by mouth. , Disp: , Rfl:  .  ferrous sulfate 325 (65 FE) MG tablet, Take 325 mg by mouth daily with breakfast. Twice a week, Disp: , Rfl:  .  naproxen  (NAPROSYN) 500 MG tablet, Take 1 tablet (500 mg total) by mouth 2 (two) times daily with a meal. (Patient taking differently: Take 500 mg by mouth daily in the afternoon.), Disp: 180 tablet, Rfl: 1 .  sildenafil (REVATIO) 20 MG tablet, Take 20 mg by mouth. 1-5 tabs daily as needed, Disp: , Rfl:  Social History   Socioeconomic History  . Marital status: Married    Spouse name: [name]  . Number of children: 2  . Years of education: 62  . Highest education level: 12th grade  Occupational History  . Occupation: truck Hospital doctor    Comment: retired  . Occupation: custodian    Comment: retired  . 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 Smoking status: Former Games developer  . Smokeless tobacco: Never Used  Substance and Sexual Activity  . Alcohol use: Yes    Comment: occasional  . Drug use: Never  . Sexual activity: Yes  Other Topics Concern  . Not on file  Social History Narrative   Works part time at [employer]   Also works part time at the [employer] lives in [place], [place]   Daughter lives in [place]  He lives locally with wife, [name]   Social Determinants of Health   Financial Resource Strain: Not on [employer] Insecurity: Not on file  Transportation Needs: Not on file  Physical Activity: Not on file  Stress: Not on file  Social Connections: Not on file  Intimate Partner Violence: Not on file   Family History  Problem Relation Age of Onset  . Heart failure Mother   . Hyperlipidemia Mother   . Cancer Father        lung    Objective: Office vital signs reviewed. BP 112/68   Pulse (!) 54   Temp (!) 97.5 F (36.4 C) (Temporal)   Ht 5\' 6"  (1.676 m)   Wt 159 lb (72.1 kg)   SpO2 99%   BMI 25.66 kg/m   Physical Examination:  General: Awake, alert, well nourished, No acute distress HEENT: Normal; sclera white.  No carotid bruits Cardio: regular rate and rhythm, S1S2 heard, no murmurs appreciated Pulm: clear to auscultation bilaterally, no  wheezes, rhonchi or rales; normal work of breathing on room air Extremities: warm, well perfused, No edema, cyanosis or clubbing; +2 pulses bilaterally MSK: normal gait and station  Assessment/ Plan: 70 y.o. male   Aortic atherosclerosis (HCC) - Plan: Lipid panel, Hepatic Function Panel  PAT (paroxysmal atrial tachycardia) (HCC)  Pure hypercholesterolemia - Plan: Lipid panel, Hepatic Function Panel  Encounter for immunization - Plan: Moderna Covid-19 Booster  Plan for future lipid panel, hepatic function panel after 6 weeks of medication.  Crestor 20 mg was sent.  He will take 1 tablet daily.  We discussed alternative regimens including Repatha and/or Nexletol today if he does not respond to Crestor as we expect.  Hopefully will have about a 50% reduction in his LDL.  He will schedule his full physical exam for May.  He has not had any rhythm abnormalities nor felt any palpitations.  Doing well from that standpoint.  Continue to follow-up with electrophysiology and cardiology recommended  No orders of the defined types were placed in this encounter.  No orders of the defined types were placed in this encounter.    June, DO [facility] [phone]

## 2020-11-23 NOTE — Patient Instructions (Addendum)
Future fasting labs are in.  Come in 6 weeks to have done  You had your COVID booster administered today. HYDRATE WELL TODAY.  Ok to take Tylenol WITH naproxen if needed for pain/ fever  Physical is due in MAY

## 2020-11-23 NOTE — Progress Notes (Signed)
   Covid-19 Vaccination Clinic  Name:  Brandon Allison    MRN: [id number] DOB: [birth year]  11/23/2020  Brandon Allison was observed post Covid-19 immunization for 15 minutes without incident. He was provided with Vaccine Information Sheet and instruction to access the V-Safe system.   Brandon Allison was instructed to call 911 with any severe reactions post vaccine: Marland Kitchen Difficulty breathing  . Swelling of face and throat  . A fast heartbeat  . A bad rash all over body  . Dizziness and weakness

## 2020-12-16 ENCOUNTER — Other Ambulatory Visit: Payer: Self-pay | Admitting: Family Medicine

## 2021-02-20 ENCOUNTER — Other Ambulatory Visit: Payer: Self-pay

## 2021-02-20 ENCOUNTER — Encounter: Payer: Self-pay | Admitting: Family Medicine

## 2021-02-20 ENCOUNTER — Ambulatory Visit (INDEPENDENT_AMBULATORY_CARE_PROVIDER_SITE_OTHER): Payer: Medicare Other | Admitting: Family Medicine

## 2021-02-20 VITALS — BP 130/73 | HR 55 | Temp 97.6°F | Ht 66.0 in | Wt 148.0 lb

## 2021-02-20 DIAGNOSIS — E7800 Pure hypercholesterolemia, unspecified: Secondary | ICD-10-CM

## 2021-02-20 DIAGNOSIS — N529 Male erectile dysfunction, unspecified: Secondary | ICD-10-CM

## 2021-02-20 DIAGNOSIS — I700 Atherosclerosis of aorta: Secondary | ICD-10-CM

## 2021-02-20 DIAGNOSIS — Z0001 Encounter for general adult medical examination with abnormal findings: Secondary | ICD-10-CM

## 2021-02-20 DIAGNOSIS — Z23 Encounter for immunization: Secondary | ICD-10-CM

## 2021-02-20 DIAGNOSIS — Z0000 Encounter for general adult medical examination without abnormal findings: Secondary | ICD-10-CM

## 2021-02-20 NOTE — Progress Notes (Signed)
Brandon Allison is a 70 y.o. male presents to office today for annual physical exam examination.    Concerns today include: 1.  Hyperlipidemia Patient was started on Crestor 20 mg daily at last visit due to elevated LDL.  His ASCVD risk score was 11.8%.  He has tolerated the medication without difficulty.  No myalgia, chest pain, abdominal pain, jaundice or scleral icterus.  He is compliant with the medication.  He has been watching his carbs in efforts to improve cholesterol.  He continues to stay physically active.  2.  Erectile dysfunction Patient takes sildenafil as needed.  He does not feel that it works as good as it should.  He was previously given samples by his previous PCP of something else but he cannot quite recall the name of it.  He has considered switching but he worries about the cost since the other 2 do not have generics.  Diet: Balanced, low-carb, Exercise: Physically active Last eye exam: Scheduled Last dental exam: Up-to-date Last colonoscopy: 2017, 10-year Refills needed today: None Immunizations needed: Shingles and tetanus Immunization History  Administered Date(s) Administered  . Fluad Quad(high Dose 65+) 09/12/2020  . Moderna SARS-COV2 Booster Vaccination 11/23/2020  . Moderna Sars-Covid-2 Vaccination 10/31/2019, 11/28/2019  . Pneumococcal Conjugate-13 09/12/2020     Past Medical History:  Diagnosis Date  . Aortic atherosclerosis (HCC)   . PAT (paroxysmal atrial tachycardia) (HCC)    Social History   Socioeconomic History  . Marital status: Married    Spouse name: [name]  . Number of children: 2  . Years of education: 24  . Highest education level: 12th grade  Occupational History  . Occupation: truck Hospital doctor    Comment: retired  . Occupation: custodian    Comment: retired  . 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 Smoking status: Former Games developer  . Smokeless tobacco: Never Used  Substance and Sexual Activity  . Alcohol  use: Yes    Comment: occasional  . Drug use: Never  . Sexual activity: Yes  Other Topics Concern  . Not on file  Social History Narrative   Works part time at [employer]   Also works part time at the [employer] lives in [place], [place]   Daughter lives in [place]   He lives locally with wife, [name]   Social Determinants of Health   Financial Resource Strain: Not on [employer] Insecurity: Not on file  Transportation Needs: Not on file  Physical Activity: Not on file  Stress: Not on file  Social Connections: Not on file  Intimate Partner Violence: Not on file   Past Surgical History:  Procedure Laterality Date  . BACK SURGERY    . KNEE SURGERY     Family History  Problem Relation Age of Onset  . Heart failure Mother   . Hyperlipidemia Mother   . Cancer Father        lung    Current Outpatient Medications:  .  Ascorbic Acid (VITAMIN C PO), Take 1,000 mg by mouth. , Disp: , Rfl:  .  aspirin EC 81 MG tablet, Take 81 mg by mouth daily., Disp: , Rfl:  .  Cholecalciferol (VITAMIN D PO), Take 1,000 Units by mouth. , Disp: , Rfl:  .  Cyanocobalamin (VITAMIN B 12 PO), Take 1,000 mcg by mouth. , Disp: , Rfl:  .  ferrous sulfate 325 (65 FE) MG tablet, Take 325 mg by mouth daily  with breakfast. Twice a week, Disp: , Rfl:  .  naproxen (NAPROSYN) 500 MG tablet, Take 1 tablet (500 mg total) by mouth daily in the afternoon., Disp: 90 tablet, Rfl: 3 .  rosuvastatin (CRESTOR) 20 MG tablet, Take 1 tablet (20 mg total) by mouth daily., Disp: 90 tablet, Rfl: 3 .  sildenafil (REVATIO) 20 MG tablet, TAKE 1 TO 5 TABLETS DAILY AS NEEDED, Disp: 30 tablet, Rfl: prn  No Known Allergies   ROS: Review of Systems Pertinent items noted in HPI and remainder of comprehensive ROS otherwise negative.    Physical exam BP 130/73   Pulse (!) 55   Temp 97.6 F (36.4 C)   Ht 5\' 6"  (1.676 m)   Wt 148 lb (67.1 kg)   SpO2 99%   BMI 23.89 kg/m  General appearance: alert, cooperative, appears  stated age and no distress Head: Normocephalic, without obvious abnormality, atraumatic Eyes: negative findings: lids and lashes normal, conjunctivae and sclerae normal, corneas clear and pupils equal, round, reactive to light and accomodation Ears: normal TM's and external ear canals both ears Nose: Nares normal. Septum midline. Mucosa normal. No drainage or sinus tenderness. Throat: lips, mucosa, and tongue normal; teeth and gums normal Neck: no adenopathy, no carotid bruit, supple, symmetrical, trachea midline and thyroid not enlarged, symmetric, no tenderness/mass/nodules Back: symmetric, no curvature. ROM normal. No CVA tenderness. Lungs: clear to auscultation bilaterally Chest wall: no tenderness Heart: regular rate and rhythm, S1, S2 normal, no murmur, click, rub or gallop Abdomen: soft, non-tender; bowel sounds normal; no masses,  no organomegaly Extremities: extremities normal, atraumatic, no cyanosis or edema Pulses: 2+ and symmetric Skin: Skin color, texture, turgor normal. No rashes or lesions or Mild dryness and peeling of skin noted in the lower extremities Lymph nodes: Cervical, supraclavicular, and axillary nodes normal. Neurologic: Grossly normal  Assessment/ Plan: here for annual physical exam.   Annual physical exam  Pure hypercholesterolemia - Plan: Hepatic Function Panel, Lipid Panel  Aortic atherosclerosis (HCC)  Erectile dysfunction, unspecified erectile dysfunction type  Recheck fasting lipid panel, liver function tests.  We discussed consideration for [facility] and information has been given.  He will let me know if he would like to have prescription sent to them.  Would consider Cialis since it is better covered by them.  Handout on healthy lifestyle choices, including diet (rich in fruits, vegetables and lean meats and low in salt and simple carbohydrates) and exercise (at least 30 minutes of moderate physical activity daily).  Patient  to follow up in 1 year for annual exam or sooner if needed.  Laren Whaling M. [name], DO

## 2021-02-20 NOTE — Patient Instructions (Addendum)
Not due for colonoscopy until 2027.  Colon cancer screening recommended from age 69-75, so the 2027 one should be your last.  Preventive Care 20 Years and Older, Male Preventive care refers to lifestyle choices and visits with your health care provider that can promote health and wellness. This includes:  A yearly physical exam. This is also called an annual wellness visit.  Regular dental and eye exams.  Immunizations.  Screening for certain conditions.  Healthy lifestyle choices, such as: ? Eating a healthy diet. ? Getting regular exercise. ? Not using drugs or products that contain nicotine and tobacco. ? Limiting alcohol use. What can I expect for my preventive care visit? Physical exam Your health care provider will check your:  Height and weight. These may be used to calculate your BMI (body mass index). BMI is a measurement that tells if you are at a healthy weight.  Heart rate and blood pressure.  Body temperature.  Skin for abnormal spots. Counseling Your health care provider may ask you questions about your:  Past medical problems.  Family's medical history.  Alcohol, tobacco, and drug use.  Emotional well-being.  Home life and relationship well-being.  Sexual activity.  Diet, exercise, and sleep habits.  History of falls.  Memory and ability to understand (cognition).  Work and work Astronomer.  Access to firearms. What immunizations do I need? Vaccines are usually given at various ages, according to a schedule. Your health care provider will recommend vaccines for you based on your age, medical history, and lifestyle or other factors, such as travel or where you work.   What tests do I need? Blood tests  Lipid and cholesterol levels. These may be checked every 5 years, or more often depending on your overall health.  Hepatitis C test.  Hepatitis B test. Screening  Lung cancer screening. You may have this screening every year starting at  age 84 if you have a 30-pack-year history of smoking and currently smoke or have quit within the past 15 years.  Colorectal cancer screening. ? All adults should have this screening starting at age 25 and continuing until age 28. ? Your health care provider may recommend screening at age 44 if you are at increased risk. ? You will have tests every 1-10 years, depending on your results and the type of screening test.  Prostate cancer screening. Recommendations will vary depending on your family history and other risks.  Genital exam to check for testicular cancer or hernias.  Diabetes screening. ? This is done by checking your blood sugar (glucose) after you have not eaten for a while (fasting). ? You may have this done every 1-3 years.  Abdominal aortic aneurysm (AAA) screening. You may need this if you are a current or former smoker.  STD (sexually transmitted disease) testing, if you are at risk. Follow these instructions at home: Eating and drinking  Eat a diet that includes fresh fruits and vegetables, whole grains, lean protein, and low-fat dairy products. Limit your intake of foods with high amounts of sugar, saturated fats, and salt.  Take vitamin and mineral supplements as recommended by your health care provider.  Do not drink alcohol if your health care provider tells you not to drink.  If you drink alcohol: ? Limit how much you have to 0-2 drinks a day. ? Be aware of how much alcohol is in your drink. In the [place] one drink equals one 12 oz bottle of beer (355 mL), one 5 oz glass of  wine (148 mL), or one 1 oz glass of hard liquor (44 mL).   Lifestyle  Take daily care of your teeth and gums. Brush your teeth every morning and night with fluoride toothpaste. Floss one time each day.  Stay active. Exercise for at least 30 minutes 5 or more days each week.  Do not use any products that contain nicotine or tobacco, such as cigarettes, e-cigarettes, and chewing tobacco. If  you need help quitting, ask your health care provider.  Do not use drugs.  If you are sexually active, practice safe sex. Use a condom or other form of protection to prevent STIs (sexually transmitted infections).  Talk with your health care provider about taking a low-dose aspirin or statin.  Find healthy ways to cope with stress, such as: ? Meditation, yoga, or listening to music. ? Journaling. ? Talking to a trusted person. ? Spending time with friends and family. Safety  Always wear your seat belt while driving or riding in a vehicle.  Do not drive: ? If you have been drinking alcohol. Do not ride with someone who has been drinking. ? When you are tired or distracted. ? While texting.  Wear a helmet and other protective equipment during sports activities.  If you have firearms in your house, make sure you follow all gun safety procedures. What's next?  Visit your health care provider once a year for an annual wellness visit.  Ask your health care provider how often you should have your eyes and teeth checked.  Stay up to date on all vaccines. This information is not intended to replace advice given to you by your health care provider. Make sure you discuss any questions you have with your health care provider. Document Revised: 07/07/2019 Document Reviewed: 10/02/2018 Elsevier Patient Education  2021 [employer].

## 2021-02-21 LAB — LIPID PANEL
Chol/HDL Ratio: 1.7 ratio (ref 0.0–5.0)
Cholesterol, Total: 193 mg/dL (ref 100–199)
HDL: 111 mg/dL (ref >39)
LDL Chol Calc (NIH): 75 mg/dL (ref 0–99)
Triglycerides: 33 mg/dL (ref 0–149)
VLDL Cholesterol Cal: 7 mg/dL (ref 5–40)

## 2021-02-21 LAB — HEPATIC FUNCTION PANEL
ALT: 26 IU/L (ref 0–44)
AST: 37 IU/L (ref 0–40)
Albumin: 4.5 g/dL (ref 3.8–4.8)
Alkaline Phosphatase: 59 IU/L (ref 44–121)
Bilirubin Total: 0.5 mg/dL (ref 0.0–1.2)
Bilirubin, Direct: 0.15 mg/dL (ref 0.00–0.40)
Total Protein: 6.7 g/dL (ref 6.0–8.5)

## 2021-05-03 DIAGNOSIS — R0602 Shortness of breath: Secondary | ICD-10-CM | POA: Diagnosis not present

## 2021-05-03 DIAGNOSIS — Z79899 Other long term (current) drug therapy: Secondary | ICD-10-CM | POA: Diagnosis not present

## 2021-05-03 DIAGNOSIS — Z7982 Long term (current) use of aspirin: Secondary | ICD-10-CM | POA: Diagnosis not present

## 2021-05-03 DIAGNOSIS — R531 Weakness: Secondary | ICD-10-CM | POA: Diagnosis not present

## 2021-05-03 DIAGNOSIS — R000 Tachycardia, unspecified: Secondary | ICD-10-CM | POA: Diagnosis not present

## 2021-05-03 DIAGNOSIS — R079 Chest pain, unspecified: Secondary | ICD-10-CM | POA: Diagnosis not present

## 2021-05-03 DIAGNOSIS — I10 Essential (primary) hypertension: Secondary | ICD-10-CM | POA: Diagnosis not present

## 2021-05-03 DIAGNOSIS — I499 Cardiac arrhythmia, unspecified: Secondary | ICD-10-CM | POA: Diagnosis not present

## 2021-05-03 DIAGNOSIS — R0789 Other chest pain: Secondary | ICD-10-CM | POA: Diagnosis not present

## 2021-05-08 ENCOUNTER — Telehealth: Payer: Self-pay | Admitting: Family Medicine

## 2021-05-08 NOTE — Telephone Encounter (Signed)
Appointment made for Dr. Nadine Counts 05/17/2022 at 8:30 am

## 2021-05-17 ENCOUNTER — Ambulatory Visit (INDEPENDENT_AMBULATORY_CARE_PROVIDER_SITE_OTHER): Payer: Medicare Other | Admitting: Family Medicine

## 2021-05-17 ENCOUNTER — Encounter: Payer: Self-pay | Admitting: Family Medicine

## 2021-05-17 ENCOUNTER — Other Ambulatory Visit: Payer: Self-pay

## 2021-05-17 VITALS — BP 138/65 | HR 52 | Ht 66.0 in | Wt 145.4 lb

## 2021-05-17 DIAGNOSIS — S51011A Laceration without foreign body of right elbow, initial encounter: Secondary | ICD-10-CM | POA: Diagnosis not present

## 2021-05-17 DIAGNOSIS — Z23 Encounter for immunization: Secondary | ICD-10-CM | POA: Diagnosis not present

## 2021-05-17 DIAGNOSIS — F5101 Primary insomnia: Secondary | ICD-10-CM

## 2021-05-17 DIAGNOSIS — R002 Palpitations: Secondary | ICD-10-CM

## 2021-05-17 LAB — HEMOGLOBIN, FINGERSTICK: Hemoglobin: 13.3 g/dL (ref 12.6–17.7)

## 2021-05-17 MED ORDER — DOXEPIN HCL 3 MG PO TABS: 3.0000 mg | ORAL_TABLET | Freq: Every evening | ORAL | 0 refills | Status: DC | PRN

## 2021-05-17 NOTE — Patient Instructions (Signed)
If you choose to use the Doxepin for sleep:  Take within of bedtime. DO NOT EAT within 3 hours of taking (doesn't work well if you do and will make you more groggy the next day)

## 2021-05-17 NOTE — Progress Notes (Addendum)
Subjective: CC: ER follow up PCP: Raliegh Ip, DO JTT:[id number] Brandon Allison is a 70 y.o. male presenting to clinic today for:  1. Palpitations Seen in the ER on 05/03/2021 and told that he had a sinus arrhythmia on EKG.  His heart rate was within normal range.  His labs were unremarkable including a negative delta troponins.  He was instructed to follow-up with cardiology.  He admits that he is reluctant to follow-up with cardiology just yet because he had quite an extensive work-up earlier this year and he was only found to have PACs/ paroxysmal atrial tachycardia.  However, due to his bradycardia he was not a good candidate for beta-blockers, calcium channel blockers and his coronary artery calcium did not make him a good candidate for flecainide or propafenone.  He denies any chest pain, change in exercise tolerance.  He thinks that the palpitations may have been related to him having gotten dehydrated after he mowed 3 lawns earlier that day.  He has had no recurrence.  He has been trying to hydrate but does admit to waking up with dry mouth occasionally and he thinks this is related to the Crestor  2.  Insomnia Patient had issues with insomnia for some time now.  He is contemplating use of Unisom for sleep.  He has never been treated with a sleep aid in the past.  Typically does not have a great deal of difficulty falling asleep but rather staying asleep.  Avoids caffeine and in fact drinks decaf coffee.  Avoids even chocolate   ROS: Per HPI  No Known Allergies Past Medical History:  Diagnosis Date   Aortic atherosclerosis (HCC)    PAT (paroxysmal atrial tachycardia) (HCC)     Current Outpatient Medications:    Ascorbic Acid (VITAMIN C PO), Take 1,000 mg by mouth. , Disp: , Rfl:    aspirin EC 81 MG tablet, Take 81 mg by mouth daily., Disp: , Rfl:    Cholecalciferol (VITAMIN D PO), Take 1,000 Units by mouth. , Disp: , Rfl:    Cyanocobalamin (VITAMIN B 12 PO), Take 1,000 mcg by  mouth. , Disp: , Rfl:    ferrous sulfate 325 (65 FE) MG tablet, Take 325 mg by mouth daily with breakfast. Twice a week, Disp: , Rfl:    naproxen (NAPROSYN) 500 MG tablet, Take 1 tablet (500 mg total) by mouth daily in the afternoon., Disp: 90 tablet, Rfl: 3   rosuvastatin (CRESTOR) 20 MG tablet, Take 1 tablet (20 mg total) by mouth daily., Disp: 90 tablet, Rfl: 3   sildenafil (REVATIO) 20 MG tablet, TAKE 1 TO 5 TABLETS DAILY AS NEEDED, Disp: 30 tablet, Rfl: prn Social History   Socioeconomic History   Marital status: Married    Spouse name: [name]   Number of children: 2   Years of education: 12   Highest education level: 12th grade  Occupational History   Occupation: truck Hospital doctor    Comment: retired   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  Tobacco Use   Smoking status: Former   Smokeless tobacco: Never  Substance and Sexual Activity   Alcohol use: Yes    Comment: occasional   Drug use: Never   Sexual activity: Yes  Other Topics Concern   Not on file  Social History Narrative   Works part time at [employer]   Also works part time at the [employer]  lives in [place], [place]   Daughter lives in [place]   He lives locally with wife, [name]   Social Determinants of Health   Financial Resource Strain: Not on [employer] Insecurity: Not on file  Transportation Needs: Not on file  Physical Activity: Not on file  Stress: Not on file  Social Connections: Not on file  Intimate Partner Violence: Not on file   Family History  Problem Relation Age of Onset   Heart failure Mother    Hyperlipidemia Mother    Cancer Father        lung    Objective: Office vital signs reviewed. BP 138/65   Pulse (!) 52   Ht 5\' 6"  (1.676 m)   Wt 145 lb 6 oz (65.9 kg)   SpO2 98%   BMI 23.46 kg/m   Physical Examination:  General: Awake, alert, well nourished, No acute distress HEENT: Normal; sclera white.  No  exophthalmos Cardio: Bradycardic with regular rhythm, S1S2 heard, no murmurs appreciated Pulm: clear to auscultation bilaterally, no wheezes, rhonchi or rales; normal work of breathing on room air Skin: Nickel size skin tear noted along the lateral right elbow.  There is no evidence of secondary infection.  Is hemostatic.  Assessment/ Plan: 70 y.o. male   Heart palpitations - Plan: Hemoglobin, fingerstick, TSH, T4, Free, Basic Metabolic Panel  Primary insomnia - Plan: Doxepin HCl 3 MG TABS  Skin tear of right elbow without complication, initial encounter  Need for Tdap vaccination  Heart palpitations possibly related to known PACs/paroxysmal atrial tachycardia.  This was not sustained nor captured on EKG according to the notes.  I am going to proceed with TSH, free T4, repeat BMP and hemoglobin.  He had a very slight discrepancy of one-point drop in hemoglobin compared to his baseline here and had a slight elevation in BUN.  Denies any bleeding today but would like to rule this out as possible cause.  I have also had him sign a release of information form to get the actual EKG for direct visualization.  We will plan to CC a copy of this to his cardiologist once available.  We discussed watchful waiting since he was very reluctant to see his cardiologist again.  I would highly recommend however if this recurs that he seek evaluation immediately.  Okay to proceed with Unisom but if not effective trial of doxepin.  He has a skin tear that does not appear infected and is hemostatic on the right elbow.  Tetanus shot was not administered due to price.  He will return in 3 months for recheck of sleep, heart palpitations and to receive his second shingles vaccination  No orders of the defined types were placed in this encounter.  No orders of the defined types were placed in this encounter.    74, DO [facility] [phone]

## 2021-05-18 LAB — BASIC METABOLIC PANEL
BUN/Creatinine Ratio: 30 — ABNORMAL HIGH (ref 10–24)
BUN: 24 mg/dL (ref 8–27)
CO2: 23 mmol/L (ref 20–29)
Calcium: 9.7 mg/dL (ref 8.6–10.2)
Chloride: 106 mmol/L (ref 96–106)
Creatinine, Ser: 0.81 mg/dL (ref 0.76–1.27)
Glucose: 94 mg/dL (ref 65–99)
Potassium: 4.6 mmol/L (ref 3.5–5.2)
Sodium: 143 mmol/L (ref 134–144)
eGFR: 96 mL/min/{1.73_m2} (ref >59)

## 2021-05-18 LAB — TSH: TSH: 3.09 u[IU]/mL (ref 0.450–4.500)

## 2021-05-18 LAB — T4, FREE: Free T4: 0.92 ng/dL (ref 0.82–1.77)

## 2021-05-23 ENCOUNTER — Ambulatory Visit (INDEPENDENT_AMBULATORY_CARE_PROVIDER_SITE_OTHER): Payer: Medicare Other

## 2021-05-23 VITALS — Ht 66.0 in | Wt 145.0 lb

## 2021-05-23 DIAGNOSIS — Z0000 Encounter for general adult medical examination without abnormal findings: Secondary | ICD-10-CM

## 2021-05-23 DIAGNOSIS — H2513 Age-related nuclear cataract, bilateral: Secondary | ICD-10-CM | POA: Diagnosis not present

## 2021-05-23 DIAGNOSIS — H40033 Anatomical narrow angle, bilateral: Secondary | ICD-10-CM | POA: Diagnosis not present

## 2021-05-23 DIAGNOSIS — H524 Presbyopia: Secondary | ICD-10-CM | POA: Diagnosis not present

## 2021-05-23 NOTE — Progress Notes (Signed)
Subjective:   Brandon Allison is a 70 y.o. male who presents for Medicare Annual/Subsequent preventive examination.  Virtual Visit via Telephone Note  I connected with  Brandon Allison on 05/23/21 at  9:45 AM EDT by telephone and verified that I am speaking with the correct person using two identifiers.  Location: Patient: Home Provider: [facility] Persons participating in the virtual visit: patient/Nurse Health Advisor   I discussed the limitations, risks, security and privacy concerns of performing an evaluation and management service by telephone and the availability of in person appointments. The patient expressed understanding and agreed to proceed.  Interactive audio and video telecommunications were attempted between this nurse and patient, however failed, due to patient having technical difficulties OR patient did not have access to video capability.  We continued and completed visit with audio only.  Some vital signs may be absent or patient reported.   Brandon Allison E Brandon Schwarz, LPN   Review of Systems     Cardiac Risk Factors include: advanced age (>19men, >56 women);male gender;dyslipidemia     Objective:    Today's Vitals   05/23/21 0949  Weight: 145 lb (65.8 kg)  Height: 5\' 6"  (1.676 m)   Body mass index is 23.4 kg/m.  Advanced Directives 05/23/2021 05/20/2020  Does Patient Have a Medical Advance Directive? No No  Would patient like information on creating a medical advance directive? No - Patient declined No - Patient declined    Current Medications (verified) Outpatient Encounter Medications as of 05/23/2021  Medication Sig   Ascorbic Acid (VITAMIN C PO) Take 1,000 mg by mouth.    aspirin EC 81 MG tablet Take 81 mg by mouth daily.   Cholecalciferol (VITAMIN D PO) Take 1,000 Units by mouth.    Cyanocobalamin (VITAMIN B 12 PO) Take 1,000 mcg by mouth.    Doxepin HCl 3 MG TABS Take 1 tablet (3 mg total) by mouth at bedtime as needed (sleep).   ferrous sulfate 325 (65 FE)  MG tablet Take 325 mg by mouth daily with breakfast. Twice a week   naproxen (NAPROSYN) 500 MG tablet Take 1 tablet (500 mg total) by mouth daily in the afternoon.   rosuvastatin (CRESTOR) 20 MG tablet Take 1 tablet (20 mg total) by mouth daily.   sildenafil (REVATIO) 20 MG tablet TAKE 1 TO 5 TABLETS DAILY AS NEEDED   No facility-administered encounter medications on file as of 05/23/2021.    Allergies (verified) Patient has no known allergies.   History: Past Medical History:  Diagnosis Date   Aortic atherosclerosis (HCC)    PAT (paroxysmal atrial tachycardia) (HCC)    Past Surgical History:  Procedure Laterality Date   BACK SURGERY     KNEE SURGERY     Family History  Problem Relation Age of Onset   Heart failure Mother    Hyperlipidemia Mother    Cancer Father        lung   Social History   Socioeconomic History   Marital status: Married    Spouse name: 07/23/2021   Number of children: 2   Years of education: 12   Highest education level: 12th grade  Occupational History   Occupation: truck [name]    Comment: retired   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  Tobacco Use   Smoking status: Former   Smokeless tobacco: Never  Substance and Sexual Activity   Alcohol use: Yes    Comment:  occasional   Drug use: Never   Sexual activity: Yes  Other Topics Concern   Not on file  Social History Narrative   Works part time at [employer]   Also works part time at the [employer] lives in [place], [place]   Daughter lives in [place]   He lives locally with wife, [name]   Social Determinants of Corporate investment banker Strain: Low Risk    Difficulty of Paying Living Expenses: Not hard at all  Food Insecurity: No Food Insecurity   Worried About Programme researcher, broadcasting/film/video in the Last Year: Never true   Barista in the Last Year: Never true  Transportation Needs: No Transportation Needs   Lack of Transportation  (Medical): No   Lack of Transportation (Non-Medical): No  Physical Activity: Sufficiently Active   Days of Exercise per Week: 7 days   Minutes of Exercise per Session: 90 min  Stress: No Stress Concern Present   Feeling of Stress : Not at all  Social Connections: Moderately Integrated   Frequency of Communication with Friends and Family: More than three times a week   Frequency of Social Gatherings with Friends and Family: More than three times a week   Attends Religious Services: More than 4 times per year   Active Member of [employer] or Organizations: No   Attends Engineer, structural: Never   Marital Status: Married    Tobacco Counseling Counseling given: Not Answered   Clinical Intake:  Pre-visit preparation completed: Yes  Pain : No/denies pain     BMI - recorded: 23.4 Nutritional Status: BMI of 19-24  Normal Nutritional Risks: None Diabetes: No  How often do you need to have someone help you when you read instructions, pamphlets, or other written materials from your doctor or pharmacy?: 1 - Never  Diabetic? No  Interpreter Needed?: No  Information entered by :: Tinika Bucknam, LPN   Activities of Daily Living In your present state of health, do you have any difficulty performing the following activities: 05/23/2021  Hearing? N  Vision? N  Difficulty concentrating or making decisions? N  Walking or climbing stairs? N  Dressing or bathing? N  Doing errands, shopping? N  Preparing Food and eating ? N  Using the Toilet? N  In the past six months, have you accidently leaked urine? N  Do you have problems with loss of bowel control? N  Managing your Medications? N  Managing your Finances? N  Housekeeping or managing your Housekeeping? N  Some recent data might be hidden    Patient Care Team: Raliegh Ip, DO as PCP - General (Family Medicine) Quintella Reichert, MD as PCP - Cardiology (Cardiology) Lanier Prude, MD as PCP - Electrophysiology  (Cardiology)  Indicate any recent Medical Services you may have received from other than [facility] providers in the past year (date may be approximate).     Assessment:   This is a routine wellness examination for Brandon Allison.  Hearing/Vision screen Hearing Screening - Comments:: Denies hearing difficulties  Vision Screening - Comments:: Wears eyeglasses - up to date with annual eye exams at [facility] in [place]  Dietary issues and exercise activities discussed: Current Exercise Habits: Home exercise routine, Type of exercise: walking, Time (Minutes): 60, Frequency (Times/Week): 7, Weekly Exercise (Minutes/Week): 420, Intensity: Mild, Exercise limited by: None identified   Goals Addressed             This Visit's Progress  Patient Stated   On track    05/20/2020 AWV Goal: Fall Prevention  Over the next year, patient will decrease their risk for falls by: Using assistive devices, such as a cane or walker, as needed Identifying fall risks within their home and correcting them by: Removing throw rugs Adding handrails to stairs or ramps Removing clutter and keeping a clear pathway throughout the home Increasing light, especially at night Adding shower handles/bars Raising toilet seat Identifying potential personal risk factors for falls: Medication side effects Incontinence/urgency Vestibular dysfunction Hearing loss Musculoskeletal disorders Neurological disorders Orthostatic hypotension         Depression Screen PHQ 2/9 Scores 05/23/2021 05/17/2021 02/20/2021 11/23/2020 09/12/2020 05/20/2020 03/16/2020  PHQ - 2 Score 0 0 0 0 0 0 0  PHQ- 9 Score - - - 0 - - -    Fall Risk Fall Risk  05/23/2021 05/17/2021 02/20/2021 11/23/2020 09/12/2020  Falls in the past year? 0 0 0 0 0  Number falls in past yr: 0 - - - -  Injury with Fall? 0 - - - -  Risk for fall due to : No Fall Risks - - - -  Follow up Falls prevention discussed - - - -    FALL RISK PREVENTION PERTAINING TO THE HOME:  Any stairs  in or around the home? Yes  If so, are there any without handrails? No  Home free of loose throw rugs in walkways, pet beds, electrical cords, etc? Yes  Adequate lighting in your home to reduce risk of falls? Yes   ASSISTIVE DEVICES UTILIZED TO PREVENT FALLS:  Life alert? No  Use of a cane, walker or w/c? No  Grab bars in the bathroom? Yes  Shower chair or bench in shower? No  Elevated toilet seat or a handicapped toilet? No   TIMED UP AND GO:  Was the test performed? No . Telephonic visit  Cognitive Function: Normal cognitive status assessed by direct observation by this Nurse Health Advisor. No abnormalities found.       6CIT Screen 05/20/2020  What Year? 0 points  What month? 0 points  What time? 0 points  Count back from 20 0 points  Months in reverse 0 points  Repeat phrase 0 points  Total Score 0    Immunizations Immunization History  Administered Date(s) Administered   Fluad Quad(high Dose 65+) 09/12/2020   Moderna SARS-COV2 Booster Vaccination 11/23/2020   Moderna Sars-Covid-2 Vaccination 10/31/2019, 11/28/2019   Pneumococcal Conjugate-13 09/12/2020   Zoster Recombinat (Shingrix) 02/20/2021    TDAP status: Due, Education has been provided regarding the importance of this vaccine. Advised may receive this vaccine at local pharmacy or Health Dept. Aware to provide a copy of the vaccination record if obtained from local pharmacy or Health Dept. Verbalized acceptance and understanding.  Flu Vaccine status: Up to date  Pneumococcal vaccine status: Up to date  Covid-19 vaccine status: Completed vaccines  Qualifies for Shingles Vaccine? Yes   Zostavax completed No   Shingrix Completed?: Yes  Screening Tests Health Maintenance  Topic Date Due   COVID-19 Vaccine (4 - Booster for Moderna series) 03/23/2021   Zoster Vaccines- Shingrix (2 of 2) 04/17/2021   INFLUENZA VACCINE  05/22/2021   TETANUS/TDAP  05/17/2022 (Originally 07/30/1970)   PNA vac Low Risk Adult (2  of 2 - PPSV23) 09/12/2021   COLONOSCOPY (Pts [phone] Insurance coverage will need to be confirmed)  10/22/2026   HPV VACCINES  Aged Out   Hepatitis C Screening  Discontinued  Health Maintenance  Health Maintenance Due  Topic Date Due   COVID-19 Vaccine (4 - Booster for Moderna series) 03/23/2021   Zoster Vaccines- Shingrix (2 of 2) 04/17/2021   INFLUENZA VACCINE  05/22/2021    Colorectal cancer screening: Type of screening: Colonoscopy. Completed 02/29/2016. Repeat every 10 years  Lung Cancer Screening: (Low Dose CT Chest recommended if Age 27-80 years, 30 pack-year currently smoking OR have quit w/in 15years.) does not qualify.   Additional Screening:  Hepatitis C Screening: does not qualify; Completed around 2020 with blood donation  Vision Screening: Recommended annual ophthalmology exams for early detection of glaucoma and other disorders of the eye. Is the patient up to date with their annual eye exam?  Yes  Who is the provider or what is the name of the office in which the patient attends annual eye exams? [facility] If pt is not established with a provider, would they like to be referred to a provider to establish care? No .   Dental Screening: Recommended annual dental exams for proper oral hygiene  Community Resource Referral / Chronic Care Management: CRR required this visit?  No   CCM required this visit?  No      Plan:     I have personally reviewed and noted the following in the patient's chart:   Medical and social history Use of alcohol, tobacco or illicit drugs  Current medications and supplements including opioid prescriptions. Patient is not currently taking opioid prescriptions. Functional ability and status Nutritional status Physical activity Advanced directives List of other physicians Hospitalizations, surgeries, and ER visits in previous 12 months Vitals Screenings to include cognitive, depression, and falls Referrals and  appointments  In addition, I have reviewed and discussed with patient certain preventive protocols, quality metrics, and best practice recommendations. A written personalized care plan for preventive services as well as general preventive health recommendations were provided to patient.     [place] Constable, LPN   10/27/1094   Nurse Notes: None

## 2021-05-23 NOTE — Patient Instructions (Signed)
[name] , Thank you for taking time to come for your Medicare Wellness Visit. I appreciate your ongoing commitment to your health goals. Please review the following plan we discussed and let me know if I can assist you in the future.   Screening recommendations/referrals: Colonoscopy: Done 02/29/2016 - Repeat in 10 years Recommended yearly ophthalmology/optometry visit for glaucoma screening and checkup Recommended yearly dental visit for hygiene and checkup  Vaccinations: Influenza vaccine: Done 09/12/2020 - Repeat annually  Pneumococcal vaccine: Done 09/12/2020, get other PNA vaccine after 08/2021 Tdap vaccine: Due. *every 10 years Shingles vaccine: Done 02/20/2021, get second dose in October   Covid-19: Done 10/31/2019, 11/28/2019, & 11/23/2020  Advanced directives: Advance directive discussed with you today. Even though you declined this today, please call our office should you change your mind, and we can give you the proper paperwork for you to fill out.   Conditions/risks identified: Aim for 30 minutes of exercise or brisk walking each day, drink 6-8 glasses of water and eat lots of fruits and vegetables.   Next appointment: Follow up in one year for your annual wellness visit.   Preventive Care 35 Years and Older, Male  Preventive care refers to lifestyle choices and visits with your health care provider that can promote health and wellness. What does preventive care include? A yearly physical exam. This is also called an annual well check. Dental exams once or twice a year. Routine eye exams. Ask your health care provider how often you should have your eyes checked. Personal lifestyle choices, including: Daily care of your teeth and gums. Regular physical activity. Eating a healthy diet. Avoiding tobacco and drug use. Limiting alcohol use. Practicing safe sex. Taking low doses of aspirin every day. Taking vitamin and mineral supplements as recommended by your health care  provider. What happens during an annual well check? The services and screenings done by your health care provider during your annual well check will depend on your age, overall health, lifestyle risk factors, and family history of disease. Counseling  Your health care provider may ask you questions about your: Alcohol use. Tobacco use. Drug use. Emotional well-being. Home and relationship well-being. Sexual activity. Eating habits. History of falls. Memory and ability to understand (cognition). Work and work Astronomer. Screening  You may have the following tests or measurements: Height, weight, and BMI. Blood pressure. Lipid and cholesterol levels. These may be checked every 5 years, or more frequently if you are over 63 years old. Skin check. Lung cancer screening. You may have this screening every year starting at age 7 if you have a 30-pack-year history of smoking and currently smoke or have quit within the past 15 years. Fecal occult blood test (FOBT) of the stool. You may have this test every year starting at age 45. Flexible sigmoidoscopy or colonoscopy. You may have a sigmoidoscopy every 5 years or a colonoscopy every 10 years starting at age 72. Prostate cancer screening. Recommendations will vary depending on your family history and other risks. Hepatitis C blood test. Hepatitis B blood test. Sexually transmitted disease (STD) testing. Diabetes screening. This is done by checking your blood sugar (glucose) after you have not eaten for a while (fasting). You may have this done every 1-3 years. Abdominal aortic aneurysm (AAA) screening. You may need this if you are a current or former smoker. Osteoporosis. You may be screened starting at age 10 if you are at high risk. Talk with your health care provider about your test results, treatment  options, and if necessary, the need for more tests. Vaccines  Your health care provider may recommend certain vaccines, such  as: Influenza vaccine. This is recommended every year. Tetanus, diphtheria, and acellular pertussis (Tdap, Td) vaccine. You may need a Td booster every 10 years. Zoster vaccine. You may need this after age 89. Pneumococcal 13-valent conjugate (PCV13) vaccine. One dose is recommended after age 74. Pneumococcal polysaccharide (PPSV23) vaccine. One dose is recommended after age 50. Talk to your health care provider about which screenings and vaccines you need and how often you need them. This information is not intended to replace advice given to you by your health care provider. Make sure you discuss any questions you have with your health care provider. Document Released: 11/04/2015 Document Revised: 06/27/2016 Document Reviewed: 08/09/2015 Elsevier Interactive Patient Education  2017 [place] Prevention in the Home Falls can cause injuries. They can happen to people of all ages. There are many things you can do to make your home safe and to help prevent falls. What can I do on the outside of my home? Regularly fix the edges of walkways and driveways and fix any cracks. Remove anything that might make you trip as you walk through a door, such as a raised step or threshold. Trim any bushes or trees on the path to your home. Use bright outdoor lighting. Clear any walking paths of anything that might make someone trip, such as rocks or tools. Regularly check to see if handrails are loose or broken. Make sure that both sides of any steps have handrails. Any raised decks and porches should have guardrails on the edges. Have any leaves, snow, or ice cleared regularly. Use sand or salt on walking paths during winter. Clean up any spills in your garage right away. This includes oil or grease spills. What can I do in the bathroom? Use night lights. Install grab bars by the toilet and in the tub and shower. Do not use towel bars as grab bars. Use non-skid mats or decals in the tub or  shower. If you need to sit down in the shower, use a plastic, non-slip stool. Keep the floor dry. Clean up any water that spills on the floor as soon as it happens. Remove soap buildup in the tub or shower regularly. Attach bath mats securely with double-sided non-slip rug tape. Do not have throw rugs and other things on the floor that can make you trip. What can I do in the bedroom? Use night lights. Make sure that you have a light by your bed that is easy to reach. Do not use any sheets or blankets that are too big for your bed. They should not hang down onto the floor. Have a firm chair that has side arms. You can use this for support while you get dressed. Do not have throw rugs and other things on the floor that can make you trip. What can I do in the kitchen? Clean up any spills right away. Avoid walking on wet floors. Keep items that you use a lot in easy-to-reach places. If you need to reach something above you, use a strong step stool that has a grab bar. Keep electrical cords out of the way. Do not use floor polish or wax that makes floors slippery. If you must use wax, use non-skid floor wax. Do not have throw rugs and other things on the floor that can make you trip. What can I do with my stairs? Do not  leave any items on the stairs. Make sure that there are handrails on both sides of the stairs and use them. Fix handrails that are broken or loose. Make sure that handrails are as long as the stairways. Check any carpeting to make sure that it is firmly attached to the stairs. Fix any carpet that is loose or worn. Avoid having throw rugs at the top or bottom of the stairs. If you do have throw rugs, attach them to the floor with carpet tape. Make sure that you have a light switch at the top of the stairs and the bottom of the stairs. If you do not have them, ask someone to add them for you. What else can I do to help prevent falls? Wear shoes that: Do not have high heels. Have  rubber bottoms. Are comfortable and fit you well. Are closed at the toe. Do not wear sandals. If you use a stepladder: Make sure that it is fully opened. Do not climb a closed stepladder. Make sure that both sides of the stepladder are locked into place. Ask someone to hold it for you, if possible. Clearly mark and make sure that you can see: Any grab bars or handrails. First and last steps. Where the edge of each step is. Use tools that help you move around (mobility aids) if they are needed. These include: Canes. Walkers. Scooters. Crutches. Turn on the lights when you go into a dark area. Replace any light bulbs as soon as they burn out. Set up your furniture so you have a clear path. Avoid moving your furniture around. If any of your floors are uneven, fix them. If there are any pets around you, be aware of where they are. Review your medicines with your doctor. Some medicines can make you feel dizzy. This can increase your chance of falling. Ask your doctor what other things that you can do to help prevent falls. This information is not intended to replace advice given to you by your health care provider. Make sure you discuss any questions you have with your health care provider. Document Released: 08/04/2009 Document Revised: 03/15/2016 Document Reviewed: 11/12/2014 Elsevier Interactive Patient Education  2017 [employer].

## 2021-07-25 ENCOUNTER — Encounter: Payer: Self-pay | Admitting: Family Medicine

## 2021-07-25 ENCOUNTER — Other Ambulatory Visit: Payer: Self-pay

## 2021-07-25 ENCOUNTER — Ambulatory Visit (INDEPENDENT_AMBULATORY_CARE_PROVIDER_SITE_OTHER): Payer: Medicare Other | Admitting: Family Medicine

## 2021-07-25 VITALS — BP 141/82 | HR 63 | Temp 98.1°F | Ht 66.0 in | Wt 147.6 lb

## 2021-07-25 DIAGNOSIS — I700 Atherosclerosis of aorta: Secondary | ICD-10-CM | POA: Diagnosis not present

## 2021-07-25 DIAGNOSIS — Z23 Encounter for immunization: Secondary | ICD-10-CM

## 2021-07-25 DIAGNOSIS — E7800 Pure hypercholesterolemia, unspecified: Secondary | ICD-10-CM | POA: Diagnosis not present

## 2021-07-25 DIAGNOSIS — F5101 Primary insomnia: Secondary | ICD-10-CM

## 2021-07-25 MED ORDER — NAPROXEN 500 MG PO TABS: 500.0000 mg | ORAL_TABLET | Freq: Every day | ORAL | 3 refills | Status: DC

## 2021-07-25 MED ORDER — ROSUVASTATIN CALCIUM 20 MG PO TABS: 20.0000 mg | ORAL_TABLET | Freq: Every day | ORAL | 3 refills | Status: DC

## 2021-07-25 MED ORDER — DOXEPIN HCL 3 MG PO TABS: 3.0000 mg | ORAL_TABLET | Freq: Every evening | ORAL | 3 refills | Status: DC | PRN

## 2021-07-25 NOTE — Progress Notes (Signed)
Subjective: CC: Hyperlipidemia, aortic atherosclerosis, insomnia PCP: Raliegh Ip, DO MCN:OBSJG T Brandon Allison is a 70 y.o. male presenting to clinic today for:  1.  Hyperlipidemia, aortic atherosclerosis Patient is compliant with Crestor 20 mg daily.  No chest pain, shortness of breath, change in exercise tolerance  2.  Insomnia Patient admits that he never started the doxepin and thinks he lost the prescription.  He continues to struggle with sleep.  No real problem falling asleep but definitely has problems staying asleep.   ROS: Per HPI  No Known Allergies Past Medical History:  Diagnosis Date   Aortic atherosclerosis (HCC)    PAT (paroxysmal atrial tachycardia) (HCC)     Current Outpatient Medications:    Ascorbic Acid (VITAMIN C PO), Take 1,000 mg by mouth. , Disp: , Rfl:    aspirin EC 81 MG tablet, Take 81 mg by mouth daily., Disp: , Rfl:    Cholecalciferol (VITAMIN D PO), Take 1,000 Units by mouth. , Disp: , Rfl:    Cyanocobalamin (VITAMIN B 12 PO), Take 1,000 mcg by mouth. , Disp: , Rfl:    Doxepin HCl 3 MG TABS, Take 1 tablet (3 mg total) by mouth at bedtime as needed (sleep)., Disp: 30 tablet, Rfl: 0   ferrous sulfate 325 (65 FE) MG tablet, Take 325 mg by mouth daily with breakfast. Twice a week, Disp: , Rfl:    naproxen (NAPROSYN) 500 MG tablet, Take 1 tablet (500 mg total) by mouth daily in the afternoon., Disp: 90 tablet, Rfl: 3   rosuvastatin (CRESTOR) 20 MG tablet, Take 1 tablet (20 mg total) by mouth daily., Disp: 90 tablet, Rfl: 3   sildenafil (REVATIO) 20 MG tablet, TAKE 1 TO 5 TABLETS DAILY AS NEEDED, Disp: 30 tablet, Rfl: prn Social History   Socioeconomic History   Marital status: Married    Spouse name: [name]   Number of children: 2   Years of education: 12   Highest education level: 12th grade  Occupational History   Occupation: truck Copy: retired   Occupation: custodian    Comment: retired   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  Tobacco Use   Smoking status: Former   Smokeless tobacco: Never  Substance and Sexual Activity   Alcohol use: Yes    Comment: occasional   Drug use: Never   Sexual activity: Yes  Other Topics Concern   Not on file  Social History Narrative   Works part time at [employer]   Also works part time at the [employer] lives in [place], [place]   Daughter lives in [place]   He lives locally with wife, Development worker, community   Social Determinants of Health   Financial Resource Strain: Low Risk    Difficulty of Paying Living Expenses: Not hard at all  Food Insecurity: No Food Insecurity   Worried About Programme researcher, broadcasting/film/video in the Last Year: Never true   Barista in the Last Year: Never true  Transportation Needs: No Transportation Needs   Lack of Transportation (Medical): No   Lack of Transportation (Non-Medical): No  Physical Activity: Sufficiently Active   Days of Exercise per Week: 7 days   Minutes of Exercise per Session: 90 min  Stress: No Stress Concern Present   Feeling of Stress : Not at all  Social Connections: Moderately Integrated   Frequency of Communication with Friends and Family: More than three times a week  Frequency of Social Gatherings with Friends and Family: More than three times a week   Attends Religious Services: More than 4 times per year   Active Member of [employer] or Organizations: No   Attends Banker Meetings: Never   Marital Status: Married  Catering manager Violence: Not At Risk   Fear of Current or Ex-Partner: No   Emotionally Abused: No   Physically Abused: No   Sexually Abused: No   Family History  Problem Relation Age of Onset   Heart failure Mother    Hyperlipidemia Mother    Cancer Father        lung    Objective: Office vital signs reviewed. BP (!) 141/82   Pulse 63   Temp 98.1 F (36.7 C)   Ht 5\' 6"  (1.676 m)   Wt 147 lb 9.6 oz (67 kg)   SpO2 98%   BMI 23.82 kg/m   Physical Examination:   General: Awake, alert, well nourished, No acute distress Cardio: regular rate and rhythm, S1S2 heard, no murmurs appreciated Pulm: clear to auscultation bilaterally, no wheezes, rhonchi or rales; normal work of breathing on room air  Assessment/ Plan: 69 y.o. male   Pure hypercholesterolemia  Aortic atherosclerosis (HCC)  Primary insomnia - Plan: Doxepin HCl 3 MG TABS  Need for immunization against influenza - Plan: Flu Vaccine QUAD High Dose(Fluad)  Need for pneumococcal vaccination - Plan: Pneumococcal conjugate vaccine 20-valent (Prevnar 20)  Not yet due for fasting lipid.  Continue statin.  Doxepin renewed for insomnia and sent to mail order as per request.  Influenza pneumococcal vaccinations administered.  He will hold off on second shingles vaccination until 2023  No orders of the defined types were placed in this encounter.  No orders of the defined types were placed in this encounter.    2024, DO [facility] [phone]

## 2021-09-24 DIAGNOSIS — J9811 Atelectasis: Secondary | ICD-10-CM | POA: Diagnosis not present

## 2021-09-24 DIAGNOSIS — M26642 Arthritis of left temporomandibular joint: Secondary | ICD-10-CM | POA: Diagnosis not present

## 2021-09-24 DIAGNOSIS — M1909 Primary osteoarthritis, other specified site: Secondary | ICD-10-CM | POA: Diagnosis not present

## 2021-09-24 DIAGNOSIS — R0789 Other chest pain: Secondary | ICD-10-CM | POA: Diagnosis not present

## 2021-09-24 DIAGNOSIS — N281 Cyst of kidney, acquired: Secondary | ICD-10-CM | POA: Diagnosis not present

## 2021-09-24 DIAGNOSIS — J3489 Other specified disorders of nose and nasal sinuses: Secondary | ICD-10-CM | POA: Diagnosis not present

## 2021-09-24 DIAGNOSIS — I2510 Atherosclerotic heart disease of native coronary artery without angina pectoris: Secondary | ICD-10-CM | POA: Diagnosis not present

## 2021-09-24 DIAGNOSIS — M4312 Spondylolisthesis, cervical region: Secondary | ICD-10-CM | POA: Diagnosis not present

## 2021-09-24 DIAGNOSIS — I6529 Occlusion and stenosis of unspecified carotid artery: Secondary | ICD-10-CM | POA: Diagnosis not present

## 2021-09-24 DIAGNOSIS — M5136 Other intervertebral disc degeneration, lumbar region: Secondary | ICD-10-CM | POA: Diagnosis not present

## 2021-09-24 DIAGNOSIS — M5030 Other cervical disc degeneration, unspecified cervical region: Secondary | ICD-10-CM | POA: Diagnosis not present

## 2021-09-24 DIAGNOSIS — S20211A Contusion of right front wall of thorax, initial encounter: Secondary | ICD-10-CM | POA: Diagnosis not present

## 2021-09-24 DIAGNOSIS — S00532A Contusion of oral cavity, initial encounter: Secondary | ICD-10-CM | POA: Diagnosis not present

## 2021-09-24 DIAGNOSIS — S3991XA Unspecified injury of abdomen, initial encounter: Secondary | ICD-10-CM | POA: Diagnosis not present

## 2021-09-24 DIAGNOSIS — S199XXA Unspecified injury of neck, initial encounter: Secondary | ICD-10-CM | POA: Diagnosis not present

## 2021-09-24 DIAGNOSIS — I700 Atherosclerosis of aorta: Secondary | ICD-10-CM | POA: Diagnosis not present

## 2021-09-24 DIAGNOSIS — R079 Chest pain, unspecified: Secondary | ICD-10-CM | POA: Diagnosis not present

## 2021-09-24 DIAGNOSIS — S0083XA Contusion of other part of head, initial encounter: Secondary | ICD-10-CM | POA: Diagnosis not present

## 2021-09-24 DIAGNOSIS — S2241XA Multiple fractures of ribs, right side, initial encounter for closed fracture: Secondary | ICD-10-CM | POA: Diagnosis not present

## 2021-09-24 DIAGNOSIS — S0993XA Unspecified injury of face, initial encounter: Secondary | ICD-10-CM | POA: Diagnosis not present

## 2021-09-26 ENCOUNTER — Ambulatory Visit (INDEPENDENT_AMBULATORY_CARE_PROVIDER_SITE_OTHER): Payer: Medicare Other | Admitting: Nurse Practitioner

## 2021-09-26 ENCOUNTER — Encounter: Payer: Self-pay | Admitting: Nurse Practitioner

## 2021-09-26 ENCOUNTER — Ambulatory Visit: Payer: Medicare Other | Admitting: Family Medicine

## 2021-09-26 VITALS — BP 138/66 | HR 62 | Temp 98.0°F | Ht 66.0 in | Wt 158.0 lb

## 2021-09-26 DIAGNOSIS — S2241XB Multiple fractures of ribs, right side, initial encounter for open fracture: Secondary | ICD-10-CM | POA: Diagnosis not present

## 2021-09-26 MED ORDER — LIDOCAINE 5 % EX PTCH: 1.0000 | MEDICATED_PATCH | CUTANEOUS | 0 refills | Status: DC

## 2021-09-26 NOTE — Patient Instructions (Signed)
Rib Fracture  A rib fracture is a break or crack in one of the bones of the ribs. The ribs are like a cage that goes around your upper chest. A broken or cracked rib is often painful, but most do not cause other problems. Most rib fractures usuallyheal on their own in 1-3 months. What are the causes? Doing movements over and over again with a lot of force, such as pitching a baseball or having a very bad cough. A direct hit to the chest. Cancer that has spread to the bones. What are the signs or symptoms? Pain when you breathe in or cough. Pain when someone presses on the injured area. Feeling short of breath. How is this treated? Treatment depends on how bad the fracture is. In general: Most rib fractures usually heal on their own in 1-3 months. Healing may take longer if you have a cough or are doing activities that make the injury worse. While you heal, you may be given medicines to control pain. You will also be taught deep breathing exercises. Very bad injuries may require a stay at the hospital or surgery. Follow these instructions at home: Managing pain, stiffness, and swelling If told, put ice on the injured area. To do this: Put ice in a plastic bag. Place a towel between your skin and the bag. Leave the ice on for 20 minutes, 2-3 times a day. Take off the ice if your skin turns bright red. This is very important. If you cannot feel pain, heat, or cold, you have a greater risk of damage to the area. Take over-the-counter and prescription medicines only as told by your doctor. Activity Avoid activities that cause pain to the injured area. Protect your injured area. Slowly increase activity as told by your doctor. General instructions Do deep breathing exercises as told by your doctor. You may be told to: Take deep breaths many times a day. Cough several times a day while hugging a pillow. Use a device (incentive spirometer) to do deep breathing many times a day. Drink enough  fluid to keep your pee (urine) clear or pale yellow. Do not wear a rib belt or binder. Keep all follow-up visits. Contact a doctor if: You have a fever. Get help right away if: You have trouble breathing. You are short of breath. You cannot stop coughing. You cough up thick or bloody spit. You feel like you may vomit (nauseous), vomit, or have belly (abdominal) pain. Your pain gets worse and medicine does not help. These symptoms may be an emergency. Get help right away. Call your local emergency services (911 in the [place] Do not wait to see if the symptoms will go away. Do not drive yourself to the hospital. Summary A rib fracture is a break or crack in one of the bones of the ribs. Apply ice to the injured area and take medicines for pain as told by your doctor. Take deep breaths and cough several times a day. Hug a pillow every time you cough. This information is not intended to replace advice given to you by your health care provider. Make sure you discuss any questions you have with your healthcare provider. Document Revised: 01/29/2020 Document Reviewed: 01/29/2020 Elsevier Patient Education  2022 Elsevier Inc.  

## 2021-09-26 NOTE — Progress Notes (Signed)
   Subjective:    Patient ID: Brandon Allison, male    DOB: 1950/11/09, 70 y.o.   MRN: [id number]  Chief Complaint: fall  HPI Patient brought in by his wife. Patient fell Thursday night at home and landed on his face. Chipped on e of his teeth and busted his lips. Fractured 3 ribs. He is feeling some better. His face is better. Main issue is his right rib pain. She bought some lidocaine patches OTC and they will not stick to his skin. Rates pain 3-4/10. Denies SOB. Has been using spirometry at home.    Review of Systems  Respiratory: Negative.    Cardiovascular: Negative.   Genitourinary:  Positive for flank pain (right).  All other systems reviewed and are negative.     Objective:   Physical Exam Vitals and nursing note reviewed.  Constitutional:      Appearance: Normal appearance.  Cardiovascular:     Rate and Rhythm: Normal rate and regular rhythm.     Heart sounds: Normal heart sounds.  Pulmonary:     Effort: Pulmonary effort is normal.     Breath sounds: Normal breath sounds.  Neurological:     General: No focal deficit present.     Mental Status: He is alert and oriented to person, place, and time.  Psychiatric:        Mood and Affect: Mood normal.        Behavior: Behavior normal.    BP 138/66   Pulse 62   Temp 98 F (36.7 C) (Temporal)   Ht 5\' 6"  (1.676 m)   Wt 158 lb (71.7 kg)   SpO2 96%   BMI 25.50 kg/m        Assessment & Plan:  in today with chief complaint of Fall (Went to [facility] for Fall has 3 broken ribs)   1. Open fracture of multiple ribs of right side, initial encounter Moist heat Or ice Rest Splint area when coughing or sneezing RTO prn  Meds ordered this encounter  Medications   lidocaine (LIDODERM) 5 %    Sig: Place 1 patch onto the skin daily. Remove & Discard patch within 12 hours or as directed by MD    Dispense:  30 patch    Refill:  0    Order Specific Question:   Supervising Provider    Answer:    11-17-1970 A [[id number]]       The above assessment and management plan was discussed with the patient. The patient verbalized understanding of and has agreed to the management plan. Patient is aware to call the clinic if symptoms persist or worsen. Patient is aware when to return to the clinic for a follow-up visit. Patient educated on when it is appropriate to go to the emergency department.   [facility], FNP

## 2021-09-27 ENCOUNTER — Other Ambulatory Visit: Payer: Self-pay | Admitting: Family Medicine

## 2021-09-29 ENCOUNTER — Other Ambulatory Visit: Payer: Self-pay | Admitting: Cardiology

## 2021-09-29 DIAGNOSIS — R002 Palpitations: Secondary | ICD-10-CM

## 2021-09-29 DIAGNOSIS — R0609 Other forms of dyspnea: Secondary | ICD-10-CM

## 2021-11-16 ENCOUNTER — Ambulatory Visit: Payer: Medicare Other

## 2021-11-16 ENCOUNTER — Telehealth: Payer: Self-pay | Admitting: Family Medicine

## 2021-11-16 NOTE — Telephone Encounter (Signed)
Pt aware we are out of shingrix at this moment. Will give a call today if we get in stock.

## 2021-11-21 ENCOUNTER — Ambulatory Visit (INDEPENDENT_AMBULATORY_CARE_PROVIDER_SITE_OTHER): Payer: Medicare Other | Admitting: *Deleted

## 2021-11-21 DIAGNOSIS — Z23 Encounter for immunization: Secondary | ICD-10-CM

## 2021-11-21 NOTE — Progress Notes (Signed)
Pt given Shingrix vaccine IM right deltoid and tolerated well. 

## 2021-12-12 ENCOUNTER — Encounter: Payer: Self-pay | Admitting: Nurse Practitioner

## 2021-12-12 ENCOUNTER — Ambulatory Visit (INDEPENDENT_AMBULATORY_CARE_PROVIDER_SITE_OTHER): Payer: Medicare Other | Admitting: Nurse Practitioner

## 2021-12-12 VITALS — BP 136/76 | HR 64 | Temp 97.8°F | Ht 66.0 in | Wt 157.8 lb

## 2021-12-12 DIAGNOSIS — J069 Acute upper respiratory infection, unspecified: Secondary | ICD-10-CM | POA: Diagnosis not present

## 2021-12-12 DIAGNOSIS — H9193 Unspecified hearing loss, bilateral: Secondary | ICD-10-CM | POA: Diagnosis not present

## 2021-12-12 MED ORDER — PSEUDOEPH-BROMPHEN-DM 30-2-10 MG/5ML PO SYRP: 5.0000 mL | ORAL_SOLUTION | Freq: Four times a day (QID) | ORAL | 0 refills | Status: DC | PRN

## 2021-12-12 NOTE — Progress Notes (Signed)
Acute Office Visit  Subjective:    Patient ID: Brandon Allison, male    DOB: 06-11-1951, 71 y.o.   MRN: [id number]  Chief Complaint  Patient presents with   Cough    Been having cough/ congestion for 2weeks, want something to help get rid of it    Cough This is a new problem. Episode onset: in the past 4 days. The problem has been unchanged. The problem occurs constantly. The cough is Non-productive. Pertinent negatives include no chills, ear congestion or ear pain. Nothing aggravates the symptoms. He has tried OTC cough suppressant for the symptoms. The treatment provided no relief.    Past Medical History:  Diagnosis Date   Aortic atherosclerosis (HCC)    PAT (paroxysmal atrial tachycardia) (HCC)     Past Surgical History:  Procedure Laterality Date   BACK SURGERY     KNEE SURGERY      Family History  Problem Relation Age of Onset   Heart failure Mother    Hyperlipidemia Mother    Cancer Father        lung    Social History   Socioeconomic History   Marital status: Married    Spouse name: Izora Gala   Number of children: 2   Years of education: 12   Highest education level: 12th grade  Occupational History   Occupation: truck Emergency planning/management officer: retired   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  Tobacco Use   Smoking status: Former   Smokeless tobacco: Never  Substance and Sexual Activity   Alcohol use: Yes    Comment: occasional   Drug use: Never   Sexual activity: Yes  Other Topics Concern   Not on file  Social History Narrative   Works part time at [employer]   Also works part time at the [employer] lives in [place], [place]   Daughter lives in [place]   He lives locally with wife, Special educational needs teacher   Social Determinants of Health   Financial Resource Strain: Low Risk    Difficulty of Paying Living Expenses: Not hard at [place]: No Food Insecurity   Worried About Charity fundraiser  in the Last Year: Never true   Arboriculturist in the Last Year: Never true  Transportation Needs: No Transportation Needs   Lack of Transportation (Medical): No   Lack of Transportation (Non-Medical): No  Physical Activity: Sufficiently Active   Days of Exercise per Week: 7 days   Minutes of Exercise per Session: 90 min  Stress: No Stress Concern Present   Feeling of Stress : Not at all  Social Connections: Moderately Integrated   Frequency of Communication with Friends and Family: More than three times a week   Frequency of Social Gatherings with Friends and Family: More than three times a week   Attends Religious Services: More than 4 times per year   Active Member of [employer] or Organizations: No   Attends Archivist Meetings: Never   Marital Status: Married  Human resources officer Violence: Not At Risk   Fear of Current or Ex-Partner: No   Emotionally Abused: No   Physically Abused: No   Sexually Abused: No    Outpatient Medications Prior to Visit  Medication Sig Dispense Refill   Ascorbic Acid (VITAMIN C PO) Take 1,000 mg by mouth.      aspirin EC 81  MG tablet Take 81 mg by mouth daily.     Cholecalciferol (VITAMIN D PO) Take 1,000 Units by mouth.      Cyanocobalamin (VITAMIN B 12 PO) Take 1,000 mcg by mouth.      ferrous sulfate 325 (65 FE) MG tablet Take 325 mg by mouth daily with breakfast. Twice a week     naproxen (NAPROSYN) 500 MG tablet Take 1 tablet (500 mg total) by mouth daily in the afternoon. 90 tablet 3   rosuvastatin (CRESTOR) 20 MG tablet TAKE 1 TABLET BY MOUTH DAILY. GENERIC EQUIVALENT FOR CRESTOR. 90 tablet 3   sildenafil (REVATIO) 20 MG tablet TAKE 1 TO 5 TABLETS DAILY AS NEEDED 30 tablet prn   Doxepin HCl 3 MG TABS Take 1 tablet (3 mg total) by mouth at bedtime as needed (sleep). (Patient not taking: Reported on 12/12/2021) 90 tablet 3   lidocaine (LIDODERM) 5 % Place 1 patch onto the skin daily. Remove & Discard patch within 12 hours or as directed by MD  (Patient not taking: Reported on 12/12/2021) 30 patch 0   No facility-administered medications prior to visit.    No Known Allergies  Review of Systems  Constitutional: Negative.  Negative for chills.  HENT:  Positive for congestion. Negative for ear pain.   Eyes: Negative.   Respiratory:  Positive for cough.   Gastrointestinal: Negative.   Genitourinary: Negative.   Musculoskeletal: Negative.   Psychiatric/Behavioral: Negative.    All other systems reviewed and are negative.     Objective:    Physical Exam Vitals and nursing note reviewed.  Constitutional:      Appearance: Normal appearance.  HENT:     Head: Normocephalic.     Right Ear: External ear normal.     Nose: Congestion present.     Mouth/Throat:     Mouth: Mucous membranes are moist.     Pharynx: Oropharynx is clear.  Eyes:     Conjunctiva/sclera: Conjunctivae normal.  Cardiovascular:     Rate and Rhythm: Normal rate and regular rhythm.     Pulses: Normal pulses.     Heart sounds: Normal heart sounds.  Pulmonary:     Effort: Pulmonary effort is normal.     Breath sounds: Normal breath sounds.  Abdominal:     General: Bowel sounds are normal.  Neurological:     Mental Status: He is oriented to person, place, and time.  Psychiatric:        Mood and Affect: Mood normal.        Behavior: Behavior normal.    BP 136/76    Pulse 64    Temp 97.8 F (36.6 C) (Temporal)    Ht 5' 6"  (1.676 m)    Wt 157 lb 12.8 oz (71.6 kg)    SpO2 96%    BMI 25.47 kg/m  Wt Readings from Last 3 Encounters:  12/12/21 157 lb 12.8 oz (71.6 kg)  09/26/21 158 lb (71.7 kg)  07/25/21 147 lb 9.6 oz (67 kg)    Health Maintenance Due  Topic Date Due   COVID-19 Vaccine (3 - Booster for Moderna series) 01/18/2021    There are no preventive care reminders to display for this patient.   Lab Results  Component Value Date   TSH 3.090 05/17/2021   Lab Results  Component Value Date   WBC 4.9 11/21/2020   HGB 14.5 11/21/2020    HCT 43.0 11/21/2020   MCV 95 11/21/2020   PLT 215 11/21/2020   Lab  Results  Component Value Date   NA 143 05/17/2021   K 4.6 05/17/2021   CO2 23 05/17/2021   GLUCOSE 94 05/17/2021   BUN 24 05/17/2021   CREATININE 0.81 05/17/2021   BILITOT 0.5 02/20/2021   ALKPHOS 59 02/20/2021   AST 37 02/20/2021   ALT 26 02/20/2021   PROT 6.7 02/20/2021   ALBUMIN 4.5 02/20/2021   CALCIUM 9.7 05/17/2021   EGFR 96 05/17/2021   Lab Results  Component Value Date   CHOL 193 02/20/2021   Lab Results  Component Value Date   HDL 111 02/20/2021   Lab Results  Component Value Date   LDLCALC 75 02/20/2021   Lab Results  Component Value Date   TRIG 33 02/20/2021   Lab Results  Component Value Date   CHOLHDL 1.7 02/20/2021   No results found for: HGBA1C     Assessment & Plan:  Take meds as prescribed - Use a cool mist humidifier  -Use saline nose sprays frequently -Force fluids -For fever or aches or pains- take Tylenol or ibuprofen. -Bromfed for cough and cold symptom -If symptoms do not improve, she may need to be COVID tested to rule this out Follow up with worsening unresolved symptoms  Problem List Items Addressed This Visit   None Visit Diagnoses     URI with cough and congestion    -  Primary   Relevant Medications   brompheniramine-pseudoephedrine-DM 30-2-10 MG/5ML syrup   Other Relevant Orders   Ambulatory referral to ENT        Meds ordered this encounter  Medications   brompheniramine-pseudoephedrine-DM 30-2-10 MG/5ML syrup    Sig: Take 5 mLs by mouth 4 (four) times daily as needed.    Dispense:  120 mL    Refill:  0    Order Specific Question:   Supervising Provider    Answer:   Claretta Fraise [[id number]]     Ivy Lynn, NP

## 2021-12-12 NOTE — Patient Instructions (Signed)

## 2021-12-13 NOTE — Addendum Note (Signed)
Addended by: Ivy Lynn on: 12/13/2021 12:55 PM   Modules accepted: Orders

## 2021-12-25 ENCOUNTER — Ambulatory Visit: Payer: Medicare Other | Admitting: Family Medicine

## 2021-12-28 ENCOUNTER — Ambulatory Visit: Payer: Medicare Other | Admitting: Nurse Practitioner

## 2022-01-22 ENCOUNTER — Ambulatory Visit (INDEPENDENT_AMBULATORY_CARE_PROVIDER_SITE_OTHER): Payer: Medicare Other | Admitting: Family Medicine

## 2022-01-22 ENCOUNTER — Encounter: Payer: Self-pay | Admitting: Family Medicine

## 2022-01-22 VITALS — BP 136/73 | HR 59 | Temp 97.5°F | Ht 66.0 in | Wt 156.8 lb

## 2022-01-22 DIAGNOSIS — I700 Atherosclerosis of aorta: Secondary | ICD-10-CM | POA: Diagnosis not present

## 2022-01-22 DIAGNOSIS — Z23 Encounter for immunization: Secondary | ICD-10-CM

## 2022-01-22 DIAGNOSIS — Z0001 Encounter for general adult medical examination with abnormal findings: Secondary | ICD-10-CM

## 2022-01-22 DIAGNOSIS — D649 Anemia, unspecified: Secondary | ICD-10-CM | POA: Diagnosis not present

## 2022-01-22 DIAGNOSIS — I471 Supraventricular tachycardia: Secondary | ICD-10-CM | POA: Diagnosis not present

## 2022-01-22 DIAGNOSIS — Z125 Encounter for screening for malignant neoplasm of prostate: Secondary | ICD-10-CM

## 2022-01-22 DIAGNOSIS — Z0000 Encounter for general adult medical examination without abnormal findings: Secondary | ICD-10-CM

## 2022-01-22 NOTE — Patient Instructions (Signed)
Preventive Care 65 Years and Older, Male °Preventive care refers to lifestyle choices and visits with your health care provider that can promote health and wellness. Preventive care visits are also called wellness exams. °What can I expect for my preventive care visit? °Counseling °During your preventive care visit, your health care provider may ask about your: °Medical history, including: °Past medical problems. °Family medical history. °History of falls. °Current health, including: °Emotional well-being. °Home life and relationship well-being. °Sexual activity. °Memory and ability to understand (cognition). °Lifestyle, including: °Alcohol, nicotine or tobacco, and drug use. °Access to firearms. °Diet, exercise, and sleep habits. °Work and work environment. °Sunscreen use. °Safety issues such as seatbelt and bike helmet use. °Physical exam °Your health care provider will check your: °Height and weight. These may be used to calculate your BMI (body mass index). BMI is a measurement that tells if you are at a healthy weight. °Waist circumference. This measures the distance around your waistline. This measurement also tells if you are at a healthy weight and may help predict your risk of certain diseases, such as type 2 diabetes and high blood pressure. °Heart rate and blood pressure. °Body temperature. °Skin for abnormal spots. °What immunizations do I need? °Vaccines are usually given at various ages, according to a schedule. Your health care provider will recommend vaccines for you based on your age, medical history, and lifestyle or other factors, such as travel or where you work. °What tests do I need? °Screening °Your health care provider may recommend screening tests for certain conditions. This may include: °Lipid and cholesterol levels. °Diabetes screening. This is done by checking your blood sugar (glucose) after you have not eaten for a while (fasting). °Hepatitis C test. °Hepatitis B test. °HIV (human  immunodeficiency virus) test. °STI (sexually transmitted infection) testing, if you are at risk. °Lung cancer screening. °Colorectal cancer screening. °Prostate cancer screening. °Abdominal aortic aneurysm (AAA) screening. You may need this if you are a current or former smoker. °Talk with your health care provider about your test results, treatment options, and if necessary, the need for more tests. °Follow these instructions at home: °Eating and drinking ° °Eat a diet that includes fresh fruits and vegetables, whole grains, lean protein, and low-fat dairy products. Limit your intake of foods with high amounts of sugar, saturated fats, and salt. °Take vitamin and mineral supplements as recommended by your health care provider. °Do not drink alcohol if your health care provider tells you not to drink. °If you drink alcohol: °Limit how much you have to 0-2 drinks a day. °Know how much alcohol is in your drink. In the [place] one drink equals one 12 oz bottle of beer (355 mL), one 5 oz glass of wine (148 mL), or one 1½ oz glass of hard liquor (44 mL). °Lifestyle °Brush your teeth every morning and night with fluoride toothpaste. Floss one time each day. °Exercise for at least 30 minutes 5 or more days each week. °Do not use any products that contain nicotine or tobacco. These products include cigarettes, chewing tobacco, and vaping devices, such as e-cigarettes. If you need help quitting, ask your health care provider. °Do not use drugs. °If you are sexually active, practice safe sex. Use a condom or other form of protection to prevent STIs. °Take aspirin only as told by your health care provider. Make sure that you understand how much to take and what form to take. Work with your health care provider to find out whether it is safe and   beneficial for you to take aspirin daily. °Ask your health care provider if you need to take a cholesterol-lowering medicine (statin). °Find healthy ways to manage stress, such  as: °Meditation, yoga, or listening to music. °Journaling. °Talking to a trusted person. °Spending time with friends and family. °Safety °Always wear your seat belt while driving or riding in a vehicle. °Do not drive: °If you have been drinking alcohol. Do not ride with someone who has been drinking. °When you are tired or distracted. °While texting. °If you have been using any mind-altering substances or drugs. °Wear a helmet and other protective equipment during sports activities. °If you have firearms in your house, make sure you follow all gun safety procedures. °Minimize exposure to UV radiation to reduce your risk of skin cancer. °What's next? °Visit your health care provider once a year for an annual wellness visit. °Ask your health care provider how often you should have your eyes and teeth checked. °Stay up to date on all vaccines. °This information is not intended to replace advice given to you by your health care provider. Make sure you discuss any questions you have with your health care provider. °Document Revised: 04/05/2021 Document Reviewed: 04/05/2021 °Elsevier Patient Education © 2022 Elsevier Inc. ° °

## 2022-01-22 NOTE — Progress Notes (Signed)
? ?Brandon Allison is a 71 y.o. male presents to office today for annual physical exam examination.   ? ?Concerns today include: ?1. Reports that he is doing relatively well aside from the fall with fractures he sustained in December. He continues to lead a healthy lifestyle. No red meat, low carb diet and stays very physically active ? ?Occupation: still works,  Substance use: none ?Diet: balanced as above, Exercise: regular ?Last eye exam: due ?Last dental exam: UTD ?Last colonoscopy: UTD ?Refills needed today: all ?Immunizations needed: tetanus ?Immunization History  ?Administered Date(s) Administered  ? Fluad Quad(high Dose 65+) 09/12/2020, 07/25/2021  ? Moderna SARS-COV2 Booster Vaccination 11/23/2020  ? Moderna Sars-Covid-2 Vaccination 10/31/2019, 11/28/2019  ? PNEUMOCOCCAL CONJUGATE-20 07/25/2021  ? Pneumococcal Conjugate-13 09/12/2020  ? Zoster Recombinat (Shingrix) 02/20/2021, 11/21/2021  ? ? ? ?Past Medical History:  ?Diagnosis Date  ? Aortic atherosclerosis ([place])   ? PAT (paroxysmal atrial tachycardia) ([place])   ? ?Social History  ? ?Socioeconomic History  ? Marital status: Married  ?  Spouse name: Brandon Allison  ? Number of children: 2  ? Years of education: 73  ? Highest education level: 12th grade  ?Occupational History  ? Occupation: truck Geophysicist/field seismologist  ?  Comment: retired  ? Occupation: custodian  ?  Comment: retired  ? 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 Smoking status: Former  ? Smokeless tobacco: Never  ?Substance and Sexual Activity  ? Alcohol use: Yes  ?  Comment: occasional  ? Drug use: Never  ? Sexual activity: Yes  ?Other Topics Concern  ? Not on file  ?Social History Narrative  ? Works part time at [employer]  ? Also works part time at [employer]  ? Son lives in [place], [place]  ? Daughter lives in [place]  ? He lives locally with wife, Brandon Allison  ? ?Social Determinants of Health  ? ?Financial Resource Strain: Low Risk   ? Difficulty of Paying Living Expenses: Not hard at  all  ?Food Insecurity: No Food Insecurity  ? Worried About Charity fundraiser in the Last Year: Never true  ? Ran Out of Food in the Last Year: Never true  ?Transportation Needs: No Transportation Needs  ? Lack of Transportation (Medical): No  ? Lack of Transportation (Non-Medical): No  ?Physical Activity: Sufficiently Active  ? Days of Exercise per Week: 7 days  ? Minutes of Exercise per Session: 90 min  ?Stress: No Stress Concern Present  ? Feeling of Stress : Not at all  ?Social Connections: Moderately Integrated  ? Frequency of Communication with Friends and Family: More than three times a week  ? Frequency of Social Gatherings with Friends and Family: More than three times a week  ? Attends Religious Services: More than 4 times per year  ? Active Member of Clubs or Organizations: No  ? Attends Archivist Meetings: Never  ? Marital Status: Married  ?Intimate Partner Violence: Not At Risk  ? Fear of Current or Ex-Partner: No  ? Emotionally Abused: No  ? Physically Abused: No  ? Sexually Abused: No  ? ?Past Surgical History:  ?Procedure Laterality Date  ? BACK SURGERY    ? KNEE SURGERY    ? ?Family History  ?Problem Relation Age of Onset  ? Heart failure Mother   ? Hyperlipidemia Mother   ? Cancer Father   ?     lung  ? ? ?Current Outpatient Medications:  ?  Ascorbic Acid (VITAMIN C PO), Take 1,000 mg by mouth. , Disp: , Rfl:  ?  aspirin EC 81 MG tablet, Take 81 mg by mouth daily., Disp: , Rfl:  ?  Cholecalciferol (VITAMIN D PO), Take 1,000 Units by mouth. , Disp: , Rfl:  ?  Cyanocobalamin (VITAMIN B 12 PO), Take 1,000 mcg by mouth. , Disp: , Rfl:  ?  ferrous sulfate 325 (65 FE) MG tablet, Take 325 mg by mouth daily with breakfast. Twice a week, Disp: , Rfl:  ?  naproxen (NAPROSYN) 500 MG tablet, Take 1 tablet (500 mg total) by mouth daily in the afternoon., Disp: 90 tablet, Rfl: 3 ?  rosuvastatin (CRESTOR) 20 MG tablet, TAKE 1 TABLET BY MOUTH DAILY. GENERIC EQUIVALENT FOR CRESTOR., Disp: 90 tablet,  Rfl: 3 ?  sildenafil (REVATIO) 20 MG tablet, TAKE 1 TO 5 TABLETS DAILY AS NEEDED, Disp: 30 tablet, Rfl: prn ?  Doxepin HCl 3 MG TABS, Take 1 tablet (3 mg total) by mouth at bedtime as needed (sleep). (Patient not taking: Reported on 12/12/2021), Disp: 90 tablet, Rfl: 3 ?  lidocaine (LIDODERM) 5 %, Place 1 patch onto the skin daily. Remove & Discard patch within 12 hours or as directed by MD (Patient not taking: Reported on 12/12/2021), Disp: 30 patch, Rfl: 0 ? ?No Known Allergies  ? ?ROS: ?Review of Systems ?Eyes: glasses ?Cardiovascular: occasional heart palpitations   ? ?Physical exam ?BP 136/73   Pulse (!) 59   Temp (!) 97.5 ?F (36.4 ?C)   Ht 5' 6"  (1.676 m)   Wt 156 lb 12.8 oz (71.1 kg)   SpO2 97%   BMI 25.31 kg/m?  ?General appearance: alert, cooperative, and appears stated age ?Head: Normocephalic, without obvious abnormality, atraumatic ?Eyes: negative findings: lids and lashes normal, conjunctivae and sclerae normal, corneas clear, and pupils equal, round, reactive to light and accomodation ?Ears: normal TM's and external ear canals both ears ?Nose: Nares normal. Septum midline. Mucosa normal. No drainage or sinus tenderness. ?Throat: lips, mucosa, and tongue normal; teeth and gums normal ?Neck: no adenopathy, no carotid bruit, supple, symmetrical, trachea midline, and thyroid not enlarged, symmetric, no tenderness/mass/nodules ?Back: symmetric, no curvature. ROM normal. No CVA tenderness. ?Lungs: clear to auscultation bilaterally ?Chest wall: no tenderness ?Heart: regular rate and rhythm, S1, S2 normal, no murmur, click, rub or gallop ?Abdomen: soft, non-tender; bowel sounds normal; no masses,  no organomegaly ?Extremities: extremities normal, atraumatic, no cyanosis or edema ?Pulses: 2+ and symmetric ?Skin: Skin color, texture, turgor normal. No rashes or lesions ?Lymph nodes: Cervical, supraclavicular, and axillary nodes normal. ?Neurologic: Grossly normal ?Psych: mood stable speech normal ? ? ?   01/22/2022  ?  8:58 AM 12/12/2021  ?  2:17 PM 09/26/2021  ?  3:13 PM  ?Depression screen PHQ 2/9  ?Decreased Interest 0 0 0  ?Down, Depressed, Hopeless 0 0 0  ?PHQ - 2 Score 0 0 0  ?Altered sleeping 0 1   ?Tired, decreased energy 0 1   ?Change in appetite 0 0   ?Feeling bad or failure about yourself  0 0   ?Trouble concentrating 0 0   ?Moving slowly or fidgety/restless 0 0   ?Suicidal thoughts 0 0   ?PHQ-9 Score 0 2   ?Difficult doing work/chores Not difficult at all Not difficult at all   ? ? ?Assessment/ Plan: ?Brandon Allison here for annual physical exam.  ? ?Annual physical exam ? ?Aortic atherosclerosis ([place]) - Plan: Lipid Panel, TSH, CMP14+EGFR ? ?PAT (paroxysmal  atrial tachycardia) ([place]) - Plan: TSH, CBC ? ?Anemia, unspecified type - Plan: CBC, Ferritin, Vitamin B12 ? ?Screening for malignant neoplasm of prostate - Plan: PSA ? ?Tetanus updated today ? ?Check fasting labs. UTD on med refills for now. ? ?Screening labs ordered. ? ?Counseled on healthy lifestyle choices, including diet (rich in fruits, vegetables and lean meats and low in salt and simple carbohydrates) and exercise (at least 30 minutes of moderate physical activity daily). ? ?Patient to follow up in 1 year for annual exam or sooner if needed. ? ?Lekita Kerekes M. Azlyn Wingler, DO ? ? ? ? ? ?

## 2022-01-23 ENCOUNTER — Encounter: Payer: Self-pay | Admitting: Family Medicine

## 2022-01-23 LAB — CMP14+EGFR
ALT: 28 IU/L (ref 0–44)
AST: 39 IU/L (ref 0–40)
Albumin/Globulin Ratio: 2.1 (ref 1.2–2.2)
Albumin: 4.8 g/dL (ref 3.8–4.8)
Alkaline Phosphatase: 64 IU/L (ref 44–121)
BUN/Creatinine Ratio: 25 — ABNORMAL HIGH (ref 10–24)
BUN: 21 mg/dL (ref 8–27)
Bilirubin Total: 0.6 mg/dL (ref 0.0–1.2)
CO2: 24 mmol/L (ref 20–29)
Calcium: 10.1 mg/dL (ref 8.6–10.2)
Chloride: 103 mmol/L (ref 96–106)
Creatinine, Ser: 0.83 mg/dL (ref 0.76–1.27)
Globulin, Total: 2.3 g/dL (ref 1.5–4.5)
Glucose: 109 mg/dL — ABNORMAL HIGH (ref 70–99)
Potassium: 5.4 mmol/L — ABNORMAL HIGH (ref 3.5–5.2)
Sodium: 139 mmol/L (ref 134–144)
Total Protein: 7.1 g/dL (ref 6.0–8.5)
eGFR: 95 mL/min/{1.73_m2} (ref >59)

## 2022-01-23 LAB — CBC
Hematocrit: 44.4 % (ref 37.5–51.0)
Hemoglobin: 14.7 g/dL (ref 13.0–17.7)
MCH: 31.3 pg (ref 26.6–33.0)
MCHC: 33.1 g/dL (ref 31.5–35.7)
MCV: 95 fL (ref 79–97)
Platelets: 220 10*3/uL (ref 150–450)
RBC: 4.69 x10E6/uL (ref 4.14–5.80)
RDW: 12.5 % (ref 11.6–15.4)
WBC: 5.2 10*3/uL (ref 3.4–10.8)

## 2022-01-23 LAB — LIPID PANEL
Chol/HDL Ratio: 1.8 ratio (ref 0.0–5.0)
Cholesterol, Total: 208 mg/dL — ABNORMAL HIGH (ref 100–199)
HDL: 117 mg/dL (ref >39)
LDL Chol Calc (NIH): 83 mg/dL (ref 0–99)
Triglycerides: 39 mg/dL (ref 0–149)
VLDL Cholesterol Cal: 8 mg/dL (ref 5–40)

## 2022-01-23 LAB — TSH: TSH: 3.13 u[IU]/mL (ref 0.450–4.500)

## 2022-01-23 LAB — VITAMIN B12: Vitamin B-12: 1006 pg/mL (ref 232–1245)

## 2022-01-23 LAB — PSA: Prostate Specific Ag, Serum: 0.8 ng/mL (ref 0.0–4.0)

## 2022-01-23 LAB — FERRITIN: Ferritin: 72 ng/mL (ref 30–400)

## 2022-04-12 ENCOUNTER — Encounter: Payer: Self-pay | Admitting: Family Medicine

## 2022-04-12 ENCOUNTER — Ambulatory Visit (INDEPENDENT_AMBULATORY_CARE_PROVIDER_SITE_OTHER): Payer: Medicare Other | Admitting: Family Medicine

## 2022-04-12 VITALS — BP 130/74 | HR 58 | Temp 97.9°F | Resp 20 | Ht 66.0 in | Wt 160.0 lb

## 2022-04-12 DIAGNOSIS — R030 Elevated blood-pressure reading, without diagnosis of hypertension: Secondary | ICD-10-CM | POA: Diagnosis not present

## 2022-04-12 NOTE — Patient Instructions (Signed)

## 2022-04-13 LAB — BMP8+EGFR
BUN/Creatinine Ratio: 31 — ABNORMAL HIGH (ref 10–24)
BUN: 22 mg/dL (ref 8–27)
CO2: 23 mmol/L (ref 20–29)
Calcium: 9.2 mg/dL (ref 8.6–10.2)
Chloride: 105 mmol/L (ref 96–106)
Creatinine, Ser: 0.72 mg/dL — ABNORMAL LOW (ref 0.76–1.27)
Glucose: 83 mg/dL (ref 70–99)
Potassium: 4.4 mmol/L (ref 3.5–5.2)
Sodium: 141 mmol/L (ref 134–144)
eGFR: 99 mL/min/{1.73_m2} (ref >59)

## 2022-05-05 DIAGNOSIS — Z6825 Body mass index (BMI) 25.0-25.9, adult: Secondary | ICD-10-CM | POA: Diagnosis not present

## 2022-05-05 DIAGNOSIS — R051 Acute cough: Secondary | ICD-10-CM | POA: Diagnosis not present

## 2022-05-05 DIAGNOSIS — Z0000 Encounter for general adult medical examination without abnormal findings: Secondary | ICD-10-CM | POA: Diagnosis not present

## 2022-05-05 DIAGNOSIS — J019 Acute sinusitis, unspecified: Secondary | ICD-10-CM | POA: Diagnosis not present

## 2022-05-21 DIAGNOSIS — H5203 Hypermetropia, bilateral: Secondary | ICD-10-CM | POA: Diagnosis not present

## 2022-05-24 ENCOUNTER — Ambulatory Visit (INDEPENDENT_AMBULATORY_CARE_PROVIDER_SITE_OTHER): Payer: Medicare Other

## 2022-05-24 VITALS — Wt 160.0 lb

## 2022-05-24 DIAGNOSIS — Z0000 Encounter for general adult medical examination without abnormal findings: Secondary | ICD-10-CM

## 2022-05-24 NOTE — Progress Notes (Signed)
Virtual Visit via Telephone Note  I connected with  Brandon Allison on 05/24/22 at 10:30 AM EDT by telephone and verified that I am speaking with the correct person using two identifiers.  Location: Patient: home Provider: [facility] Persons participating in the virtual visit: patient/Nurse Health Advisor   I discussed the limitations, risks, security and privacy concerns of performing an evaluation and management service by telephone and the availability of in person appointments. The patient expressed understanding and agreed to proceed.  Interactive audio and video telecommunications were attempted between this nurse and patient, however failed, due to patient having technical difficulties OR patient did not have access to video capability.  We continued and completed visit with audio only.  Some vital signs may be absent or patient reported.   Hal Hope, LPN  Subjective:   Brandon Allison is a 71 y.o. male who presents for Medicare Annual/Subsequent preventive examination.  Review of Systems     Cardiac Risk Factors include: advanced age (>46men, >47 women);dyslipidemia;male gender     Objective:    There were no vitals filed for this visit. There is no height or weight on file to calculate BMI.     05/24/2022   10:41 AM 05/23/2021   10:05 AM 05/20/2020    9:35 AM  Advanced Directives  Does Patient Have a Medical Advance Directive? No No No  Would patient like information on creating a medical advance directive? No - Patient declined No - Patient declined No - Patient declined    Current Medications (verified) Outpatient Encounter Medications as of 05/24/2022  Medication Sig   Ascorbic Acid (VITAMIN C PO) Take 1,000 mg by mouth.    aspirin EC 81 MG tablet Take 81 mg by mouth daily.   Cholecalciferol (VITAMIN D PO) Take 1,000 Units by mouth.    Cyanocobalamin (VITAMIN B 12 PO) Take 1,000 mcg by mouth.    ferrous sulfate 325 (65 FE) MG tablet Take 325 mg by mouth daily  with breakfast. Twice a week   naproxen (NAPROSYN) 500 MG tablet Take 1 tablet (500 mg total) by mouth daily in the afternoon.   rosuvastatin (CRESTOR) 20 MG tablet TAKE 1 TABLET BY MOUTH DAILY. GENERIC EQUIVALENT FOR CRESTOR.   sildenafil (REVATIO) 20 MG tablet TAKE 1 TO 5 TABLETS DAILY AS NEEDED   No facility-administered encounter medications on file as of 05/24/2022.    Allergies (verified) Patient has no known allergies.   History: Past Medical History:  Diagnosis Date   Aortic atherosclerosis (HCC)    PAT (paroxysmal atrial tachycardia) (HCC)    Past Surgical History:  Procedure Laterality Date   BACK SURGERY     KNEE SURGERY     Family History  Problem Relation Age of Onset   Heart failure Mother    Hyperlipidemia Mother    Cancer Father        lung   Social History   Socioeconomic History   Marital status: Married    Spouse name: Brandon Allison   Number of children: 2   Years of education: 12   Highest education level: 12th grade  Occupational History   Occupation: truck Hospital doctor    Comment: retired   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  Tobacco Use   Smoking status: Former   Smokeless tobacco: Never  Substance and Sexual Activity   Alcohol use: Yes    Comment: occasional   Drug use:  Never   Sexual activity: Yes  Other Topics Concern   Not on file  Social History Narrative   Works part time at [employer]   Also works part time at the [employer] lives in [place], [place]   Daughter lives in [place]   He lives locally with wife, Brandon Allison   Social Determinants of Health   Financial Resource Strain: Low Risk  (05/24/2022)   Overall Financial Resource Strain (CARDIA)    Difficulty of Paying Living Expenses: Not hard at all  Food Insecurity: No Food Insecurity (05/24/2022)   Hunger Vital Sign    Worried About Running Out of Food in the Last Year: Never true    Ran Out of Food in the Last Year: Never true   Transportation Needs: No Transportation Needs (05/24/2022)   PRAPARE - Administrator, Civil Service (Medical): No    Lack of Transportation (Non-Medical): No  Physical Activity: Sufficiently Active (05/24/2022)   Exercise Vital Sign    Days of Exercise per Week: 7 days    Minutes of Exercise per Session: 60 min  Stress: No Stress Concern Present (05/24/2022)   [employer] of Occupational Health - Occupational Stress Questionnaire    Feeling of Stress : Not at all  Social Connections: Moderately Integrated (05/24/2022)   Social Connection and Isolation Panel [NHANES]    Frequency of Communication with Friends and Family: More than three times a week    Frequency of Social Gatherings with Friends and Family: Once a week    Attends Religious Services: More than 4 times per year    Active Member of [employer] or Organizations: No    Attends Engineer, structural: Never    Marital Status: Married    Tobacco Counseling Counseling given: Not Answered   Clinical Intake:  Pre-visit preparation completed: Yes  Pain : No/denies pain     Nutritional Risks: None Diabetes: No  How often do you need to have someone help you when you read instructions, pamphlets, or other written materials from your doctor or pharmacy?: 1 - Never  Diabetic?no  Interpreter Needed?: No  Information entered by :: Kennedy Bucker, LPN   Activities of Daily Living    05/24/2022   10:42 AM  In your present state of health, do you have any difficulty performing the following activities:  Hearing? 1  Vision? 0  Difficulty concentrating or making decisions? 0  Walking or climbing stairs? 0  Dressing or bathing? 0  Doing errands, shopping? 0  Preparing Food and eating ? N  Using the Toilet? N  In the past six months, have you accidently leaked urine? N  Do you have problems with loss of bowel control? N  Managing your Medications? N  Managing your Finances? N  Housekeeping or managing  your Housekeeping? N    Patient Care Team: Raliegh Ip, DO as PCP - General (Family Medicine) Quintella Reichert, MD as PCP - Cardiology (Cardiology) Lanier Prude, MD as PCP - Electrophysiology (Cardiology)  Indicate any recent Medical Services you may have received from other than [facility] providers in the past year (date may be approximate).     Assessment:   This is a routine wellness examination for [place].  Hearing/Vision screen Hearing Screening - Comments:: Wears aids Vision Screening - Comments:: Wears glasses- [facility] in [place]  Dietary issues and exercise activities discussed: Current Exercise Habits: Home exercise routine, Type of exercise: walking, Time (Minutes): 60, Frequency (Times/Week):  7, Weekly Exercise (Minutes/Week): 420, Intensity: Mild   Goals Addressed             This Visit's Progress    DIET - EAT MORE FRUITS AND VEGETABLES         Depression Screen    05/24/2022   10:39 AM 04/12/2022   12:04 PM 01/22/2022    8:58 AM 12/12/2021    2:17 PM 09/26/2021    3:13 PM 07/25/2021    8:27 AM 05/23/2021   10:02 AM  PHQ 2/9 Scores  PHQ - 2 Score 0 0 0 0 0 0 0  PHQ- 9 Score 0 2 0 2       Fall Risk    05/24/2022   10:42 AM 04/12/2022   12:04 PM 01/22/2022    8:57 AM 12/12/2021    2:16 PM 09/26/2021    3:13 PM  Fall Risk   Falls in the past year? 1 1 0 0 1  Number falls in past yr: 0 0   0  Injury with Fall? 1 1  0 1  Risk for fall due to : History of fall(s) History of fall(s)  No Fall Risks History of fall(s)  Follow up Falls prevention discussed Education provided   Falls prevention discussed    FALL RISK PREVENTION PERTAINING TO THE HOME:  Any stairs in or around the home? Yes  If so, are there any without handrails? No  Home free of loose throw rugs in walkways, pet beds, electrical cords, etc? Yes  Adequate lighting in your home to reduce risk of falls? Yes   ASSISTIVE DEVICES UTILIZED TO PREVENT FALLS:  Life alert? No  Use of a cane,  walker or w/c? No  Grab bars in the bathroom? No  Shower chair or bench in shower? No  Elevated toilet seat or a handicapped toilet? No     Cognitive Function:        05/24/2022   10:44 AM 05/20/2020    9:38 AM  6CIT Screen  What Year? 0 points 0 points  What month? 0 points 0 points  What time? 0 points 0 points  Count back from 20 0 points 0 points  Months in reverse 0 points 0 points  Repeat phrase 0 points 0 points  Total Score 0 points 0 points    Immunizations Immunization History  Administered Date(s) Administered   Fluad Quad(high Dose 65+) 09/12/2020, 07/25/2021   Moderna SARS-COV2 Booster Vaccination 11/23/2020   Moderna Sars-Covid-2 Vaccination 10/31/2019, 11/28/2019   PNEUMOCOCCAL CONJUGATE-20 07/25/2021   Pneumococcal Conjugate-13 09/12/2020   Tdap 01/22/2022   Zoster Recombinat (Shingrix) 02/20/2021, 11/21/2021    TDAP status: Up to date  Flu Vaccine status: Up to date  Pneumococcal vaccine status: Up to date  Covid-19 vaccine status: Completed vaccines  Qualifies for Shingles Vaccine? Yes   Zostavax completed No   Shingrix Completed?: Yes  Screening Tests Health Maintenance  Topic Date Due   COVID-19 Vaccine (3 - Moderna series) 01/18/2021   INFLUENZA VACCINE  05/22/2022   COLONOSCOPY (Pts 45-69yrs Insurance coverage will need to be confirmed)  10/22/2026   TETANUS/TDAP  01/23/2032   Pneumonia Vaccine 88+ Years old  Completed   Zoster Vaccines- Shingrix  Completed   HPV VACCINES  Aged Out   Hepatitis C Screening  Discontinued    Health Maintenance  Health Maintenance Due  Topic Date Due   COVID-19 Vaccine (3 - Moderna series) 01/18/2021   INFLUENZA VACCINE  05/22/2022  Colorectal cancer screening: Type of screening: Colonoscopy. Completed 10/22/16. Repeat every 10 years  Lung Cancer Screening: (Low Dose CT Chest recommended if Age 12-80 years, 30 pack-year currently smoking OR have quit w/in 15years.) does not qualify.   Additional  Screening:  Hepatitis C Screening: does qualify; Completed no  Vision Screening: Recommended annual ophthalmology exams for early detection of glaucoma and other disorders of the eye. Is the patient up to date with their annual eye exam?  Yes  Who is the provider or what is the name of the office in which the patient attends annual eye exams? [facility] in [place] If pt is not established with a provider, would they like to be referred to a provider to establish care? No .   Dental Screening: Recommended annual dental exams for proper oral hygiene  Community Resource Referral / Chronic Care Management: CRR required this visit?  No   CCM required this visit?  No      Plan:     I have personally reviewed and noted the following in the patient's chart:   Medical and social history Use of alcohol, tobacco or illicit drugs  Current medications and supplements including opioid prescriptions. Patient is not currently taking opioid prescriptions. Functional ability and status Nutritional status Physical activity Advanced directives List of other physicians Hospitalizations, surgeries, and ER visits in previous 12 months Vitals Screenings to include cognitive, depression, and falls Referrals and appointments  In addition, I have reviewed and discussed with patient certain preventive protocols, quality metrics, and best practice recommendations. A written personalized care plan for preventive services as well as general preventive health recommendations were provided to patient.     Hal Hope, LPN   01/20/9378   Nurse Notes: none

## 2022-05-24 NOTE — Patient Instructions (Signed)
[name] , Thank you for taking time to come for your Medicare Wellness Visit. I appreciate your ongoing commitment to your health goals. Please review the following plan we discussed and let me know if I can assist you in the future.   Screening recommendations/referrals: Colonoscopy: 10/22/16 Recommended yearly ophthalmology/optometry visit for glaucoma screening and checkup Recommended yearly dental visit for hygiene and checkup  Vaccinations: Influenza vaccine: 07/25/21 Pneumococcal vaccine: 07/25/21 Tdap vaccine: 01/22/22 Shingles vaccine: Shingrix 02/20/21, 11/21/21   Covid-19: 10/31/19, 11/28/19, 11/23/20  Advanced directives: no  Conditions/risks identified: none  Next appointment: Follow up in one year for your annual wellness visit. 05/28/23 @ 10:30am by phone  Preventive Care 65 Years and Older, Male Preventive care refers to lifestyle choices and visits with your health care provider that can promote health and wellness. What does preventive care include? A yearly physical exam. This is also called an annual well check. Dental exams once or twice a year. Routine eye exams. Ask your health care provider how often you should have your eyes checked. Personal lifestyle choices, including: Daily care of your teeth and gums. Regular physical activity. Eating a healthy diet. Avoiding tobacco and drug use. Limiting alcohol use. Practicing safe sex. Taking low doses of aspirin every day. Taking vitamin and mineral supplements as recommended by your health care provider. What happens during an annual well check? The services and screenings done by your health care provider during your annual well check will depend on your age, overall health, lifestyle risk factors, and family history of disease. Counseling  Your health care provider may ask you questions about your: Alcohol use. Tobacco use. Drug use. Emotional well-being. Home and relationship well-being. Sexual activity. Eating  habits. History of falls. Memory and ability to understand (cognition). Work and work Astronomer. Screening  You may have the following tests or measurements: Height, weight, and BMI. Blood pressure. Lipid and cholesterol levels. These may be checked every 5 years, or more frequently if you are over 57 years old. Skin check. Lung cancer screening. You may have this screening every year starting at age 60 if you have a 30-pack-year history of smoking and currently smoke or have quit within the past 15 years. Fecal occult blood test (FOBT) of the stool. You may have this test every year starting at age 77. Flexible sigmoidoscopy or colonoscopy. You may have a sigmoidoscopy every 5 years or a colonoscopy every 10 years starting at age 44. Prostate cancer screening. Recommendations will vary depending on your family history and other risks. Hepatitis C blood test. Hepatitis B blood test. Sexually transmitted disease (STD) testing. Diabetes screening. This is done by checking your blood sugar (glucose) after you have not eaten for a while (fasting). You may have this done every 1-3 years. Abdominal aortic aneurysm (AAA) screening. You may need this if you are a current or former smoker. Osteoporosis. You may be screened starting at age 72 if you are at high risk. Talk with your health care provider about your test results, treatment options, and if necessary, the need for more tests. Vaccines  Your health care provider may recommend certain vaccines, such as: Influenza vaccine. This is recommended every year. Tetanus, diphtheria, and acellular pertussis (Tdap, Td) vaccine. You may need a Td booster every 10 years. Zoster vaccine. You may need this after age 77. Pneumococcal 13-valent conjugate (PCV13) vaccine. One dose is recommended after age 70. Pneumococcal polysaccharide (PPSV23) vaccine. One dose is recommended after age 85. Talk to your health  care provider about which screenings and  vaccines you need and how often you need them. This information is not intended to replace advice given to you by your health care provider. Make sure you discuss any questions you have with your health care provider. Document Released: 11/04/2015 Document Revised: 06/27/2016 Document Reviewed: 08/09/2015 Elsevier Interactive Patient Education  2017 [employer] Prevention in the Home Falls can cause injuries. They can happen to people of all ages. There are many things you can do to make your home safe and to help prevent falls. What can I do on the outside of my home? Regularly fix the edges of walkways and driveways and fix any cracks. Remove anything that might make you trip as you walk through a door, such as a raised step or threshold. Trim any bushes or trees on the path to your home. Use bright outdoor lighting. Clear any walking paths of anything that might make someone trip, such as rocks or tools. Regularly check to see if handrails are loose or broken. Make sure that both sides of any steps have handrails. Any raised decks and porches should have guardrails on the edges. Have any leaves, snow, or ice cleared regularly. Use sand or salt on walking paths during winter. Clean up any spills in your garage right away. This includes oil or grease spills. What can I do in the bathroom? Use night lights. Install grab bars by the toilet and in the tub and shower. Do not use towel bars as grab bars. Use non-skid mats or decals in the tub or shower. If you need to sit down in the shower, use a plastic, non-slip stool. Keep the floor dry. Clean up any water that spills on the floor as soon as it happens. Remove soap buildup in the tub or shower regularly. Attach bath mats securely with double-sided non-slip rug tape. Do not have throw rugs and other things on the floor that can make you trip. What can I do in the bedroom? Use night lights. Make sure that you have a light by your  bed that is easy to reach. Do not use any sheets or blankets that are too big for your bed. They should not hang down onto the floor. Have a firm chair that has side arms. You can use this for support while you get dressed. Do not have throw rugs and other things on the floor that can make you trip. What can I do in the kitchen? Clean up any spills right away. Avoid walking on wet floors. Keep items that you use a lot in easy-to-reach places. If you need to reach something above you, use a strong step stool that has a grab bar. Keep electrical cords out of the way. Do not use floor polish or wax that makes floors slippery. If you must use wax, use non-skid floor wax. Do not have throw rugs and other things on the floor that can make you trip. What can I do with my stairs? Do not leave any items on the stairs. Make sure that there are handrails on both sides of the stairs and use them. Fix handrails that are broken or loose. Make sure that handrails are as long as the stairways. Check any carpeting to make sure that it is firmly attached to the stairs. Fix any carpet that is loose or worn. Avoid having throw rugs at the top or bottom of the stairs. If you do have throw rugs, attach them to  the floor with carpet tape. Make sure that you have a light switch at the top of the stairs and the bottom of the stairs. If you do not have them, ask someone to add them for you. What else can I do to help prevent falls? Wear shoes that: Do not have high heels. Have rubber bottoms. Are comfortable and fit you well. Are closed at the toe. Do not wear sandals. If you use a stepladder: Make sure that it is fully opened. Do not climb a closed stepladder. Make sure that both sides of the stepladder are locked into place. Ask someone to hold it for you, if possible. Clearly mark and make sure that you can see: Any grab bars or handrails. First and last steps. Where the edge of each step is. Use tools that  help you move around (mobility aids) if they are needed. These include: Canes. Walkers. Scooters. Crutches. Turn on the lights when you go into a dark area. Replace any light bulbs as soon as they burn out. Set up your furniture so you have a clear path. Avoid moving your furniture around. If any of your floors are uneven, fix them. If there are any pets around you, be aware of where they are. Review your medicines with your doctor. Some medicines can make you feel dizzy. This can increase your chance of falling. Ask your doctor what other things that you can do to help prevent falls. This information is not intended to replace advice given to you by your health care provider. Make sure you discuss any questions you have with your health care provider. Document Released: 08/04/2009 Document Revised: 03/15/2016 Document Reviewed: 11/12/2014 Elsevier Interactive Patient Education  2017 [employer].

## 2022-06-20 IMAGING — CT CT CARDIAC CORONARY ARTERY CALCIUM SCORE
3 series · 14 of 20 positions shown, 15 images · non-contrast
Comparison: None.
COMPARISON: None.

Addendum:
EXAM:
OVER-READ INTERPRETATION  CT CHEST

The following report is an over-read performed by radiologist Dr.
Maach Ned [REDACTED] on 11/04/2020. This
over-read does not include interpretation of cardiac or coronary
anatomy or pathology. The coronary calcium score interpretation by
the cardiologist is attached.
CLINICAL DATA: Risk stratification
Coronary Calcium Score
TECHNIQUE: The patient was scanned on a Siemens Force scanner. Axial
non-contrast 3 mm slices were carried out through the heart. The
data set was analyzed on a dedicated work station and scored using
the Agatson method.

[Series 2: casc 3.0 bv41 2 bestdiast 69 % · axial · 0.38mm/px · z∈[-215,-143]mm · 4 of 40 slices shown, 5 images]
[im 8/40  vessel]
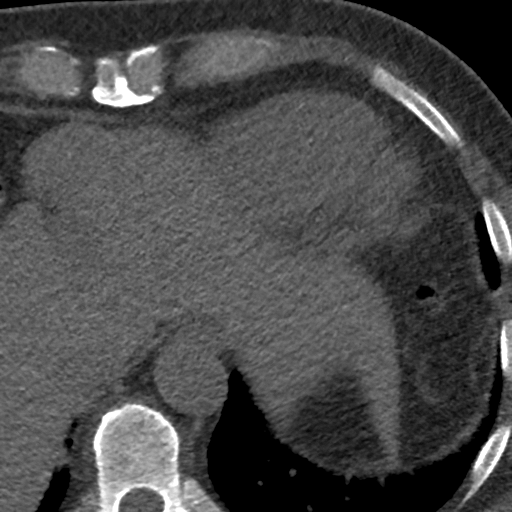
[im 8/40  lung]
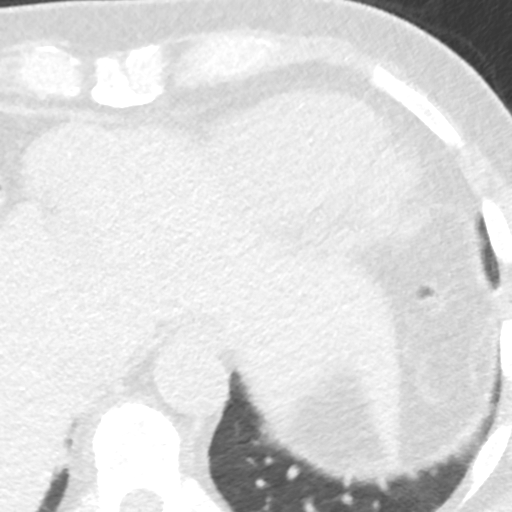
[im 16/40  vessel]
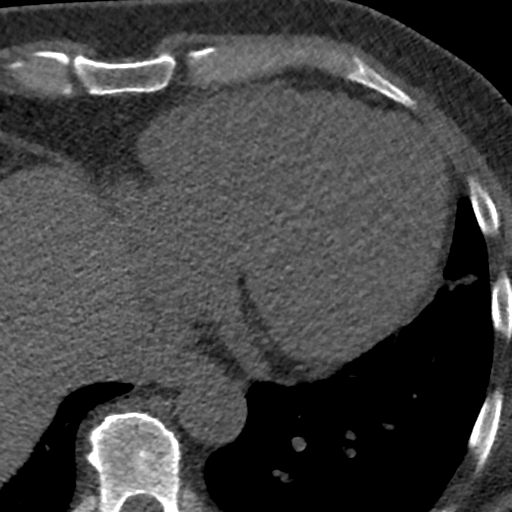
[im 24/40  vessel]
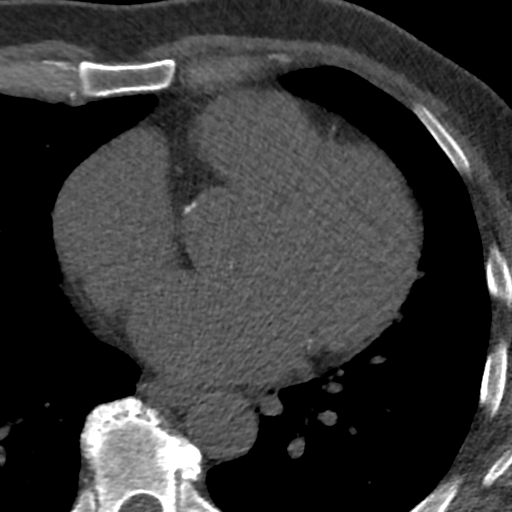
[im 32/40  vessel]
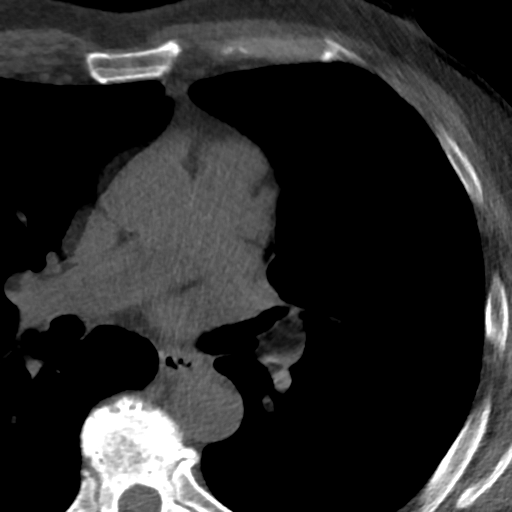

[Series 3: lung 70 % · axial · 0.67mm/px · z∈[-220,-138]mm · 5 of 41 slices shown]
[im 7/41  lung]
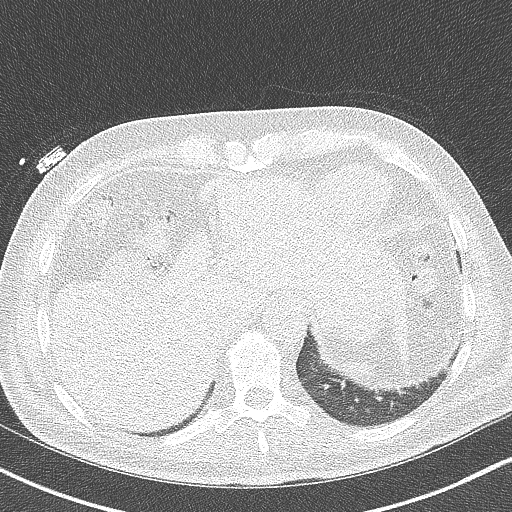
[im 14/41  lung]
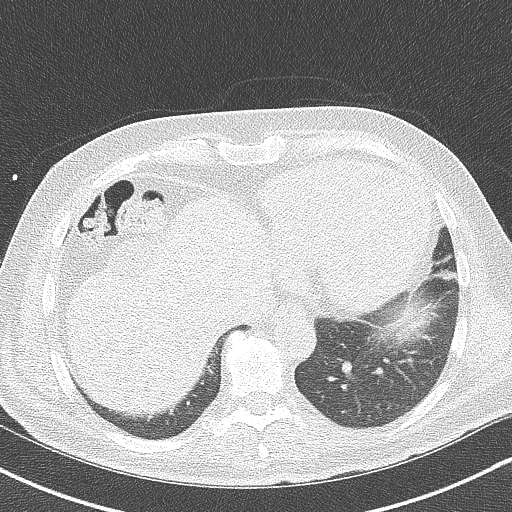
[im 21/41  lung]
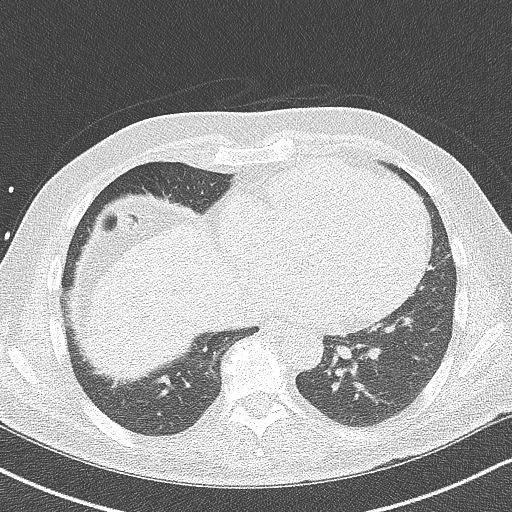
[im 27/41  lung]
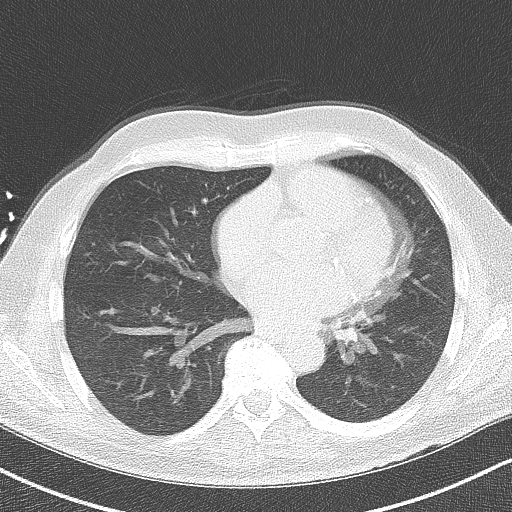
[im 34/41  lung]
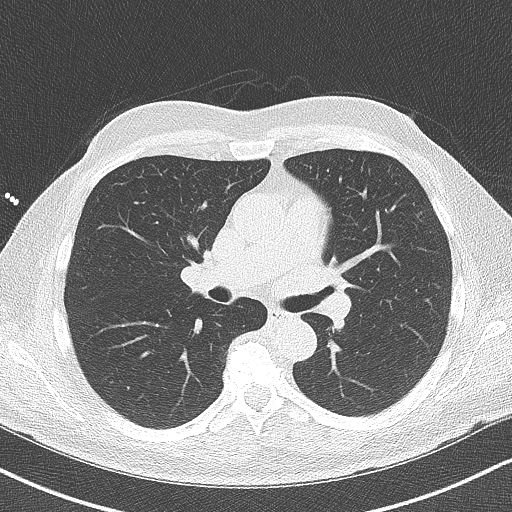

[Series 4: lung st 70 % · axial · 0.67mm/px · z∈[-220,-138]mm · 5 of 41 slices shown]
[im 7/41  lung]
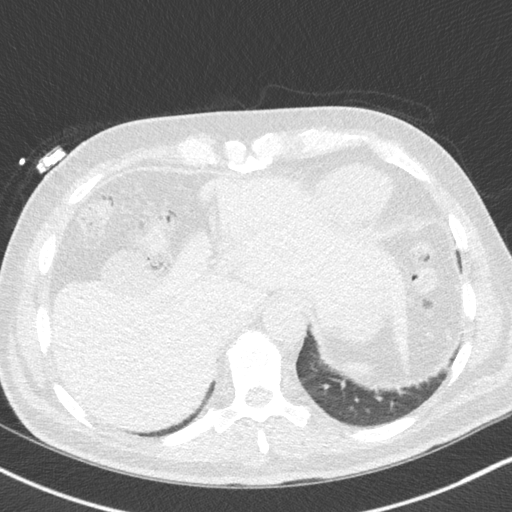
[im 14/41  lung]
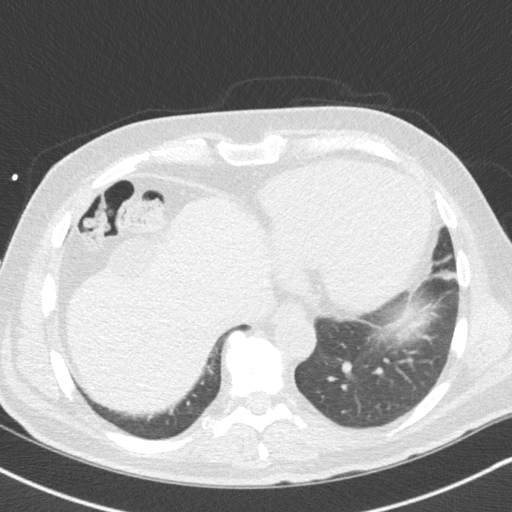
[im 21/41  lung]
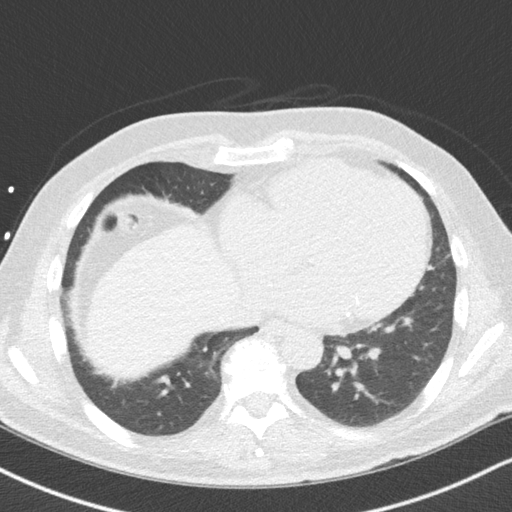
[im 27/41  lung]
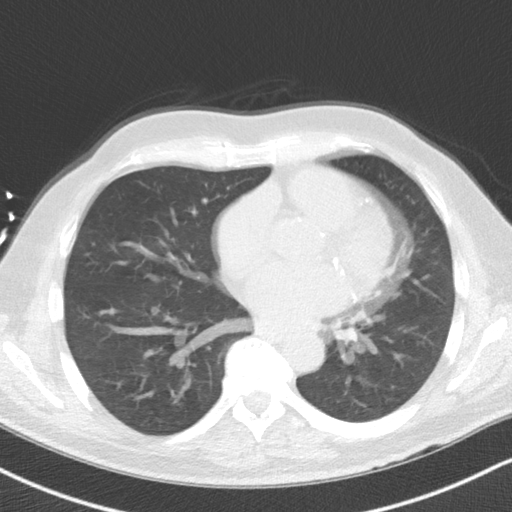
[im 34/41  lung]
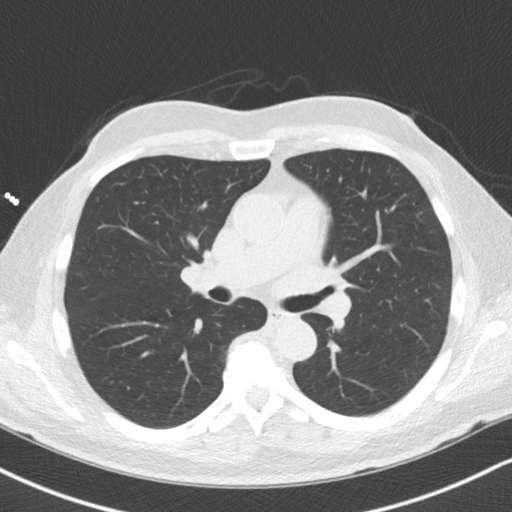

[14 of 20 positions shown; findings below may reference images not displayed]

FINDINGS: Aortic atherosclerosis. Within the visualized portions of the thorax
there are no suspicious appearing pulmonary nodules or masses, there
is no acute consolidative airspace disease, no pleural effusions, no
pneumothorax and no lymphadenopathy. Visualized portions of the
upper abdomen are unremarkable. There are no aggressive appearing
lytic or blastic lesions noted in the visualized portions of the
skeleton.
IMPRESSION: 1.  Aortic Atherosclerosis (0CR91-3FD.D).
FINDINGS: Non-cardiac: See separate report from [REDACTED].

Ascending Aorta: Normal Caliber. Scattered calcifications in the
ascending aorta.

Pericardium: Normal

Coronary arteries: Normal coronary origins. Coronary calcifications
in the LAD and LCx.
IMPRESSION: Coronary calcium score of 518. This was 77th percentile for age and
sex matched control.

Aortic atherosclerosis of the ascending thoracic aorta.

Gadiez Oei

*** End of Addendum ***
EXAM:
OVER-READ INTERPRETATION  CT CHEST

The following report is an over-read performed by radiologist Dr.
Maach Ned [REDACTED] on 11/04/2020. This
over-read does not include interpretation of cardiac or coronary
anatomy or pathology. The coronary calcium score interpretation by
the cardiologist is attached.
FINDINGS: Aortic atherosclerosis. Within the visualized portions of the thorax
there are no suspicious appearing pulmonary nodules or masses, there
is no acute consolidative airspace disease, no pleural effusions, no
pneumothorax and no lymphadenopathy. Visualized portions of the
upper abdomen are unremarkable. There are no aggressive appearing
lytic or blastic lesions noted in the visualized portions of the
skeleton.
IMPRESSION: 1.  Aortic Atherosclerosis (0CR91-3FD.D).

## 2022-07-24 ENCOUNTER — Other Ambulatory Visit: Payer: Self-pay | Admitting: Family Medicine

## 2022-08-06 ENCOUNTER — Encounter: Payer: Self-pay | Admitting: Nurse Practitioner

## 2022-08-06 ENCOUNTER — Ambulatory Visit (INDEPENDENT_AMBULATORY_CARE_PROVIDER_SITE_OTHER): Payer: Medicare Other | Admitting: Nurse Practitioner

## 2022-08-06 VITALS — BP 138/73 | HR 62 | Temp 98.6°F | Ht 66.0 in | Wt 161.0 lb

## 2022-08-06 DIAGNOSIS — H5789 Other specified disorders of eye and adnexa: Secondary | ICD-10-CM

## 2022-08-06 NOTE — Patient Instructions (Signed)

## 2022-08-06 NOTE — Progress Notes (Signed)
Acute Office Visit  Subjective:     Patient ID: Brandon Allison, male    DOB: March 24, 1951, 71 y.o.   MRN: [id number]  Chief Complaint  Patient presents with   Facial Injury    About a week ago pt states he hit himself with a limb    HPI  Patient presents with swelling and tenderness right lower eye from a mild accident/trauma 8 days ago. A branch hit the patient in the eye while he was trimming. Patient reports that it bleed and hurt. Today patient is in clininc because the swelling has tunrned to a hard nut underneath the skin. No signs of infection, patient denies, fever, blurred vision or headache  Review of Systems  Constitutional: Negative.  Negative for chills, diaphoresis and fever.  HENT: Negative.    Respiratory: Negative.    Cardiovascular: Negative.   Genitourinary: Negative.   Skin: Negative.  Negative for itching and rash.       Wound and swelling right lower eye  All other systems reviewed and are negative.       Objective:    BP 138/73   Pulse 62   Temp 98.6 F (37 C)   Ht 5\' 6"  (1.676 m)   Wt 161 lb (73 kg)   SpO2 96%   BMI 25.99 kg/m  BP Readings from Last 3 Encounters:  08/06/22 138/73  04/12/22 130/74  01/22/22 136/73      Physical Exam Vitals and nursing note reviewed.  Constitutional:      Appearance: Normal appearance.  HENT:     Head: Normocephalic.     Right Ear: External ear normal.     Left Ear: External ear normal.     Nose: Nose normal.     Mouth/Throat:     Mouth: Mucous membranes are moist.     Pharynx: Oropharynx is clear.  Eyes:     Conjunctiva/sclera: Conjunctivae normal.      Comments: Wound and swelling right lower eye  Cardiovascular:     Rate and Rhythm: Normal rate and regular rhythm.     Pulses: Normal pulses.     Heart sounds: Normal heart sounds.  Pulmonary:     Effort: Pulmonary effort is normal.     Breath sounds: Normal breath sounds.  Abdominal:     General: Bowel sounds are normal.  Skin:     General: Skin is warm.     Findings: No erythema or rash.  Neurological:     General: No focal deficit present.     Mental Status: He is alert and oriented to person, place, and time.     No results found for any visits on 08/06/22.      Assessment & Plan:  Patient presents with swelling and tenderness right lower eye from a mild accident/trauma 8 days ago. A branch hit the patient in the eye while he was trimming. Patient reports that it bleed and hurt. Today patient is in clinic because the swelling has turned to a hard nut underneath the skin. No signs of infection,    Advised patient to continue to monitor skin, warm compress daily, if unresolved, general surgery may need to repair.  Tylenol or ibuprofen for pain as needed.  Problem List Items Addressed This Visit   None Visit Diagnoses     Swelling of right eye    -  Primary       No orders of the defined types were placed in this encounter.  Return if symptoms worsen or fail to improve.  Ivy Lynn, NP

## 2022-11-14 DIAGNOSIS — M79672 Pain in left foot: Secondary | ICD-10-CM | POA: Diagnosis not present

## 2022-11-14 DIAGNOSIS — M2041 Other hammer toe(s) (acquired), right foot: Secondary | ICD-10-CM | POA: Diagnosis not present

## 2022-11-14 DIAGNOSIS — M2042 Other hammer toe(s) (acquired), left foot: Secondary | ICD-10-CM | POA: Diagnosis not present

## 2022-11-14 DIAGNOSIS — M79671 Pain in right foot: Secondary | ICD-10-CM | POA: Diagnosis not present

## 2022-12-04 ENCOUNTER — Other Ambulatory Visit: Payer: Self-pay | Admitting: Family Medicine

## 2022-12-25 ENCOUNTER — Other Ambulatory Visit: Payer: Self-pay | Admitting: Family Medicine

## 2023-01-01 MED ORDER — NAPROXEN 500 MG PO TABS: ORAL_TABLET | ORAL | 1 refills | Status: DC

## 2023-01-01 NOTE — Telephone Encounter (Signed)
Refill failed. resent °

## 2023-01-01 NOTE — Addendum Note (Signed)
Addended by: Antonietta Barcelona D on: 01/01/2023 10:08 AM   Modules accepted: Orders

## 2023-01-22 DIAGNOSIS — K080 Exfoliation of teeth due to systemic causes: Secondary | ICD-10-CM | POA: Diagnosis not present

## 2023-02-12 DIAGNOSIS — K080 Exfoliation of teeth due to systemic causes: Secondary | ICD-10-CM | POA: Diagnosis not present

## 2023-03-27 ENCOUNTER — Encounter: Payer: Self-pay | Admitting: Family Medicine

## 2023-03-27 ENCOUNTER — Ambulatory Visit (INDEPENDENT_AMBULATORY_CARE_PROVIDER_SITE_OTHER): Payer: Medicare Other | Admitting: Family Medicine

## 2023-03-27 VITALS — BP 126/71 | HR 57 | Temp 98.5°F | Ht 66.0 in | Wt 158.0 lb

## 2023-03-27 DIAGNOSIS — Z0000 Encounter for general adult medical examination without abnormal findings: Secondary | ICD-10-CM

## 2023-03-27 DIAGNOSIS — I4719 Other supraventricular tachycardia: Secondary | ICD-10-CM

## 2023-03-27 DIAGNOSIS — L570 Actinic keratosis: Secondary | ICD-10-CM | POA: Diagnosis not present

## 2023-03-27 DIAGNOSIS — E7800 Pure hypercholesterolemia, unspecified: Secondary | ICD-10-CM | POA: Diagnosis not present

## 2023-03-27 DIAGNOSIS — G8929 Other chronic pain: Secondary | ICD-10-CM

## 2023-03-27 DIAGNOSIS — Z0001 Encounter for general adult medical examination with abnormal findings: Secondary | ICD-10-CM

## 2023-03-27 DIAGNOSIS — Z125 Encounter for screening for malignant neoplasm of prostate: Secondary | ICD-10-CM

## 2023-03-27 DIAGNOSIS — I700 Atherosclerosis of aorta: Secondary | ICD-10-CM | POA: Diagnosis not present

## 2023-03-27 DIAGNOSIS — M545 Low back pain, unspecified: Secondary | ICD-10-CM

## 2023-03-27 MED ORDER — ROSUVASTATIN CALCIUM 20 MG PO TABS: ORAL_TABLET | ORAL | 3 refills | Status: DC

## 2023-03-27 MED ORDER — NAPROXEN 500 MG PO TABS: ORAL_TABLET | ORAL | 3 refills | Status: DC

## 2023-03-27 NOTE — Progress Notes (Signed)
Brandon Allison is a 72 y.o. male presents to office today for annual physical exam examination.    Concerns today include: 1. none  Occupation: retired, Marital status: married, Substance use: none Diet: keto/ [place], Exercise: very active. Last colonoscopy: UTD Immunizations needed: Immunization History  Administered Date(s) Administered   Fluad Quad(high Dose 65+) 09/12/2020, 07/25/2021   Moderna SARS-COV2 Booster Vaccination 11/23/2020   Moderna Sars-Covid-2 Vaccination 10/31/2019, 11/28/2019   PNEUMOCOCCAL CONJUGATE-20 07/25/2021   Pneumococcal Conjugate-13 09/12/2020   Tdap 01/22/2022   Zoster Recombinat (Shingrix) 02/20/2021, 11/21/2021     Past Medical History:  Diagnosis Date   Aortic atherosclerosis (HCC)    PAT (paroxysmal atrial tachycardia)    Social History   Socioeconomic History   Marital status: Married    Spouse name: [name]   Number of children: 2   Years of education: 12   Highest education level: 12th grade  Occupational History   Occupation: truck Copy: retired   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  Tobacco Use   Smoking status: Former   Smokeless tobacco: Never  Substance and Sexual Activity   Alcohol use: Yes    Comment: occasional   Drug use: Never   Sexual activity: Yes  Other Topics Concern   Not on file  Social History Narrative   Works part time at [employer]   Also works part time at the [employer] lives in [place], [place]   Daughter lives in [place]   He lives locally with wife, [name]   Social Determinants of Health   Financial Resource Strain: Low Risk  (05/24/2022)   Overall Financial Resource Strain (CARDIA)    Difficulty of Paying Living Expenses: Not hard at all  Food Insecurity: No Food Insecurity (05/24/2022)   Hunger Vital Sign    Worried About Running Out of Food in the Last Year: Never true    Ran Out of Food in the Last Year: Never  true  Transportation Needs: No Transportation Needs (05/24/2022)   PRAPARE - Administrator, Civil Service (Medical): No    Lack of Transportation (Non-Medical): No  Physical Activity: Sufficiently Active (05/24/2022)   Exercise Vital Sign    Days of Exercise per Week: 7 days    Minutes of Exercise per Session: 60 min  Stress: No Stress Concern Present (05/24/2022)   [employer] of Occupational Health - Occupational Stress Questionnaire    Feeling of Stress : Not at all  Social Connections: Moderately Integrated (05/24/2022)   Social Connection and Isolation Panel [NHANES]    Frequency of Communication with Friends and Family: More than three times a week    Frequency of Social Gatherings with Friends and Family: Once a week    Attends Religious Services: More than 4 times per year    Active Member of [employer] or Organizations: No    Attends Banker Meetings: Never    Marital Status: Married  Catering manager Violence: Not At Risk (05/24/2022)   Humiliation, Afraid, Rape, and Kick questionnaire    Fear of Current or Ex-Partner: No    Emotionally Abused: No    Physically Abused: No    Sexually Abused: No   Past Surgical History:  Procedure Laterality Date   BACK SURGERY     KNEE SURGERY     Family History  Problem Relation Age of Onset   Heart  failure Mother    Hyperlipidemia Mother    Cancer Father        lung    Current Outpatient Medications:    Ascorbic Acid (VITAMIN C PO), Take 1,000 mg by mouth. , Disp: , Rfl:    aspirin EC 81 MG tablet, Take 81 mg by mouth daily., Disp: , Rfl:    Cholecalciferol (VITAMIN D PO), Take 1,000 Units by mouth. , Disp: , Rfl:    Cyanocobalamin (VITAMIN B 12 PO), Take 1,000 mcg by mouth. , Disp: , Rfl:    sildenafil (REVATIO) 20 MG tablet, TAKE 1 TO 5 TABLETS DAILY AS NEEDED, Disp: 30 tablet, Rfl: prn   ferrous sulfate 325 (65 FE) MG tablet, Take 325 mg by mouth daily with breakfast. Twice a week (Patient not taking:  Reported on 03/27/2023), Disp: , Rfl:    naproxen (NAPROSYN) 500 MG tablet, TAKE 1 TABLET BY MOUTH DAILY AS NEEDED FOR MODERATE PAIN, Disp: 100 tablet, Rfl: 3   rosuvastatin (CRESTOR) 20 MG tablet, TAKE ONE TABLET BY MOUTH DAILY( GENERIC EQUIVALENT FOR CRESTOR), Disp: 100 tablet, Rfl: 3  No Known Allergies   ROS: Review of Systems Integument/breast: scalp lesion Musculoskeletal:positive for back pain    Physical exam BP 126/71   Pulse (!) 57   Temp 98.5 F (36.9 C)   Ht 5\' 6"  (1.676 m)   SpO2 98%   BMI 25.99 kg/m  General appearance: alert, cooperative, appears stated age, and no distress Head: Normocephalic, without obvious abnormality, atraumatic Eyes: negative findings: lids and lashes normal, conjunctivae and sclerae normal, corneas clear, and pupils equal, round, reactive to light and accomodation Ears: normal TM's and external ear canals both ears Nose: Nares normal. Septum midline. Mucosa normal. No drainage or sinus tenderness. Throat: lips, mucosa, and tongue normal; teeth and gums normal Neck: no adenopathy, no carotid bruit, supple, symmetrical, trachea midline, and thyroid not enlarged, symmetric, no tenderness/mass/nodules Back:  hunched and stiff gait noted.  Increased thoracic kyphosis. No midline TTP Lungs: clear to auscultation bilaterally Chest wall: no tenderness Heart: regular rate and rhythm, S1, S2 normal, no murmur, click, rub or gallop Abdomen: soft, non-tender; bowel sounds normal; no masses,  no organomegaly Extremities: extremities normal, atraumatic, no cyanosis or edema Pulses: 2+ and symmetric Skin:  Actinic keratosis noted along the right temple region of the scalp.  He has several areas of hyperpigmentation and abrasions noted along upper extremity and lower extremities Lymph nodes: Cervical, supraclavicular, and axillary nodes normal. Neurologic: Grossly normal, except wears hearing aids     03/27/2023   10:01 AM 05/24/2022   10:39 AM 04/12/2022    12:04 PM  Depression screen PHQ 2/9  Decreased Interest 0 0 0  Down, Depressed, Hopeless 0 0 0  PHQ - 2 Score 0 0 0  Altered sleeping 0 0 2  Tired, decreased energy 0 0 0  Change in appetite 0 0 0  Feeling bad or failure about yourself  0 0 0  Trouble concentrating 0 0 0  Moving slowly or fidgety/restless 0 0 0  Suicidal thoughts 0 0 0  PHQ-9 Score 0 0 2  Difficult doing work/chores Not difficult at all Not difficult at all Not difficult at all   Cryotherapy Procedure:  Risks and benefits of procedure were reviewed with the patient.  Written consent obtained and scanned into the chart.  Lesion of concern was identified and located on right temporal scalp.  Liquid nitrogen was applied to area of concern and extending out  1 millimeters beyond the border of the lesion.  Treated area was allowed to come back to room temperature before treating it a second time.  Patient tolerated procedure well and there were no immediate complications.  Home care instructions were reviewed with the patient and a handout was provided.   Assessment/ Plan: [name] here for annual physical exam.   Annual physical exam  Chronic bilateral low back pain without sciatica  Actinic keratoses  Pure hypercholesterolemia - Plan: Lipid Panel, TSH, CMP14+EGFR, rosuvastatin (CRESTOR) 20 MG tablet  Aortic atherosclerosis (HCC) - Plan: Lipid Panel, TSH, CMP14+EGFR, rosuvastatin (CRESTOR) 20 MG tablet  PAT (paroxysmal atrial tachycardia) - Plan: TSH, Magnesium, CBC, CMP14+EGFR  Screening for malignant neoplasm of prostate - Plan: CBC, PSA  Chronic back pain but does not wish for any additional medications.  Counseled on monotherapy of NSAIDs and to not overuse.  Recommended lidocaine patch, heat  Actinic keratosis of the scalp treated with cryoablation today.  Home care instructions reviewed and reasons for reevaluation discussed  Fasting labs collected.  Statin renewed.  Continue blood pressure,  cholesterol control to reduce risk of progression of vascular disease  Check magnesium, TSH, electrolytes and CBC given history of PAT  Asymptomatic from prostate standpoint.  Check PSA  Counseled on healthy lifestyle choices, including diet (rich in fruits, vegetables and lean meats and low in salt and simple carbohydrates) and exercise (at least 30 minutes of moderate physical activity daily).  Patient to follow up in 1 year for annual exam or sooner if needed.  Dequita Schleicher M. Nadine Counts, DO

## 2023-03-27 NOTE — Patient Instructions (Signed)
Actinic Keratosis An actinic keratosis is a precancerous growth on the skin. If there is more than one, the condition is called actinic keratoses. These growths appear most often on parts of the skin that get a lot of sun exposure, including the: Scalp. Face. Ears. Lips. Upper back. Forearms. Backs of the hands. If left untreated, these growths may develop into a skin cancer called squamous cell carcinoma. It is important to have all these growths checked by a health care provider to determine the best treatment. What are the causes? Actinic keratoses are caused by getting too much ultraviolet (UV) radiation from the sun or other UV light sources. What increases the risk? You are more likely to develop this condition if you: Have light-colored skin or blue eyes. Have blond or red hair. Spend a lot of time in the sun. Do not protect your skin from the sun when outdoors. Are an older person. The risk of developing an actinic keratosis increases with age. What are the signs or symptoms? These growths feel like scaly, rough spots of skin. Symptoms of this condition include growths that may: Be as small as a pinhead or as big as a quarter. Itch, hurt, or feel sensitive. Be skin-colored, light tan, dark tan, pink, or a combination of these colors. In most cases, the growths become red. Have a small piece of pink or gray skin (skin tag) growing from them. It may be easier to notice the growths by feeling them rather than seeing them. Sometimes, actinic keratoses disappear but may return a few days to a few weeks later. How is this diagnosed? This condition is usually diagnosed with a physical exam. A tissue sample may be removed from the growth and examined under a microscope (biopsy). How is this treated? This condition may be treated by: Scraping off the actinic keratosis (curettage). Freezing the actinic keratosis with liquid nitrogen (cryosurgery). This causes the growth to eventually  fall off. Applying medicated creams or gels to destroy the cells in the growth. Applying chemicals to the growth to make the outer layers of skin peel off (chemical peel). Using photodynamic therapy. In this procedure, medicated cream is applied to the actinic keratosis. This cream increases your skin's sensitivity to light. Then, a strong light is aimed at the actinic keratosis to destroy cells in the growth. Follow these instructions at home: Skin care Apply cool, wet cloths (coolcompresses) to the affected areas. Do not scratch your skin. Check your skin regularly for any growths, especially ones that: Start to itch or bleed. Change in size, shape, or color. Caring for the treated area Keep the treated area clean and dry as told by your health care provider. Do not apply any medicine, cream, or lotion to the treated area unless your health care provider tells you to do that. Do not pick at blisters or try to break them open. This can cause infection and scarring. If you have red or irritated skin after treatment, follow instructions from your health care provider about how to take care of the treated area. Make sure you: Wash your hands with soap and water for at least 20 seconds before and after you change your bandage (dressing). If soap and water are not available, use hand sanitizer. Change your dressing as told by your health care provider. If you have red or irritated skin after treatment, check the treated area every day for signs of infection. Check for: Redness, swelling, or pain. Fluid or blood. Warmth. Pus or a bad  smell. Lifestyle Do not use any products that contain nicotine or tobacco. These products include cigarettes, chewing tobacco, and vaping devices, such as e-cigarettes. If you need help quitting, ask your health care provider. Take steps to protect your skin from the sun, such as: Avoiding the sun between 10:00 a.m. and 4:00 p.m. This is when the UV light is the  strongest. Using a sunscreen or sunblock with SPF 30 or greater. Applying sunscreen before you are exposed to sunlight and reapplying as often as told by the instructions on the sunscreen container. Wearing protective gear, including: Sunglasses with UV protection. A hat and clothing that protect your skin from sunlight. Avoiding medicines that increase your sensitivity to sunlight when possible. Avoidingtanning beds and other indoor tanning devices. General instructions Take or apply over-the-counter and prescription medicines only as told by your health care provider. Return to your normal activities as told by your health care provider. Ask your health care provider what activities are safe for you. Have a skin exam done every year by a health care provider who is a skin specialist (dermatologist). Keep all follow-up visits. Your health care provider will want to check that the site has healed after treatment. Contact a health care provider if: You notice any changes or new growths on your skin. You have swelling, pain, or redness around your treated area. You have fluid or blood coming from your treated area. Your treated area feels warm to the touch. You have pus or a bad smell coming from your treated area. You have a fever or chills. You have a blister that becomes large and painful. Summary An actinic keratosis is a precancerous growth on the skin.If left untreated, these growths can develop into skin cancer. Check your skin regularly for any growths, especially growths that start to itch or bleed, or change in size, shape, or color. Take steps to protect your skin from the sun. Contact a health care provider if you notice any changes or new growths on your skin. This information is not intended to replace advice given to you by your health care provider. Make sure you discuss any questions you have with your health care provider. Document Revised: 12/21/2021 Document Reviewed:  12/21/2021 Elsevier Patient Education  2024 [employer].

## 2023-03-28 LAB — CMP14+EGFR
ALT: 29 IU/L (ref 0–44)
AST: 45 IU/L — ABNORMAL HIGH (ref 0–40)
Albumin/Globulin Ratio: 1.8 (ref 1.2–2.2)
Albumin: 4.6 g/dL (ref 3.9–4.9)
Alkaline Phosphatase: 67 IU/L (ref 44–121)
BUN/Creatinine Ratio: 37 — ABNORMAL HIGH (ref 10–24)
BUN: 29 mg/dL — ABNORMAL HIGH (ref 8–27)
Bilirubin Total: 0.6 mg/dL (ref 0.0–1.2)
CO2: 23 mmol/L (ref 20–29)
Calcium: 9.8 mg/dL (ref 8.6–10.2)
Chloride: 103 mmol/L (ref 96–106)
Creatinine, Ser: 0.78 mg/dL (ref 0.76–1.27)
Globulin, Total: 2.5 g/dL (ref 1.5–4.5)
Glucose: 87 mg/dL (ref 70–99)
Potassium: 4.8 mmol/L (ref 3.5–5.2)
Sodium: 138 mmol/L (ref 134–144)
Total Protein: 7.1 g/dL (ref 6.0–8.5)
eGFR: 96 mL/min/{1.73_m2} (ref >59)

## 2023-03-28 LAB — CBC
Hematocrit: 38.2 % (ref 37.5–51.0)
Hemoglobin: 12.5 g/dL — ABNORMAL LOW (ref 13.0–17.7)
MCH: 29.1 pg (ref 26.6–33.0)
MCHC: 32.7 g/dL (ref 31.5–35.7)
MCV: 89 fL (ref 79–97)
Platelets: 235 10*3/uL (ref 150–450)
RBC: 4.29 x10E6/uL (ref 4.14–5.80)
RDW: 13.1 % (ref 11.6–15.4)
WBC: 6.3 10*3/uL (ref 3.4–10.8)

## 2023-03-28 LAB — LIPID PANEL
Chol/HDL Ratio: 1.8 ratio (ref 0.0–5.0)
Cholesterol, Total: 241 mg/dL — ABNORMAL HIGH (ref 100–199)
HDL: 137 mg/dL (ref >39)
LDL Chol Calc (NIH): 99 mg/dL (ref 0–99)
Triglycerides: 27 mg/dL (ref 0–149)
VLDL Cholesterol Cal: 5 mg/dL (ref 5–40)

## 2023-03-28 LAB — PSA: Prostate Specific Ag, Serum: 0.7 ng/mL (ref 0.0–4.0)

## 2023-03-28 LAB — MAGNESIUM: Magnesium: 2 mg/dL (ref 1.6–2.3)

## 2023-03-28 LAB — TSH: TSH: 2.29 u[IU]/mL (ref 0.450–4.500)

## 2023-04-10 ENCOUNTER — Other Ambulatory Visit: Payer: Medicare Other

## 2023-04-10 ENCOUNTER — Other Ambulatory Visit: Payer: Self-pay | Admitting: Family Medicine

## 2023-04-10 DIAGNOSIS — D649 Anemia, unspecified: Secondary | ICD-10-CM

## 2023-04-10 LAB — ANEMIA PROFILE B
Basophils Absolute: 0 10*3/uL (ref 0.0–0.2)
Basos: 1 %
Retic Ct Pct: 1.1 % (ref 0.6–2.6)
WBC: 6.1 10*3/uL (ref 3.4–10.8)

## 2023-04-11 ENCOUNTER — Other Ambulatory Visit: Payer: Medicare Other

## 2023-04-11 DIAGNOSIS — D649 Anemia, unspecified: Secondary | ICD-10-CM | POA: Diagnosis not present

## 2023-04-11 LAB — ANEMIA PROFILE B
EOS (ABSOLUTE): 0.2 10*3/uL (ref 0.0–0.4)
Eos: 3 %
Ferritin: 189 ng/mL (ref 30–400)
Folate: 19.8 ng/mL (ref >3.0)
Hematocrit: 38.7 % (ref 37.5–51.0)
Hemoglobin: 12.7 g/dL — ABNORMAL LOW (ref 13.0–17.7)
Immature Grans (Abs): 0 10*3/uL (ref 0.0–0.1)
Immature Granulocytes: 0 %
Iron: 374 ug/dL (ref 38–169)
Lymphocytes Absolute: 1.5 10*3/uL (ref 0.7–3.1)
Lymphs: 25 %
MCH: 29.6 pg (ref 26.6–33.0)
MCHC: 32.8 g/dL (ref 31.5–35.7)
MCV: 90 fL (ref 79–97)
Monocytes Absolute: 0.7 10*3/uL (ref 0.1–0.9)
Monocytes: 11 %
Neutrophils Absolute: 3.7 10*3/uL (ref 1.4–7.0)
Neutrophils: 60 %
Platelets: 207 10*3/uL (ref 150–450)
RBC: 4.29 x10E6/uL (ref 4.14–5.80)
RDW: 13.1 % (ref 11.6–15.4)
Vitamin B-12: 1244 pg/mL (ref 232–1245)

## 2023-04-12 LAB — FECAL OCCULT BLOOD, IMMUNOCHEMICAL: Fecal Occult Bld: NEGATIVE

## 2023-05-01 DIAGNOSIS — L03031 Cellulitis of right toe: Secondary | ICD-10-CM | POA: Diagnosis not present

## 2023-05-01 DIAGNOSIS — M79674 Pain in right toe(s): Secondary | ICD-10-CM | POA: Diagnosis not present

## 2023-05-01 DIAGNOSIS — L600 Ingrowing nail: Secondary | ICD-10-CM | POA: Diagnosis not present

## 2023-05-01 DIAGNOSIS — M79671 Pain in right foot: Secondary | ICD-10-CM | POA: Diagnosis not present

## 2023-05-07 DIAGNOSIS — G8929 Other chronic pain: Secondary | ICD-10-CM | POA: Diagnosis not present

## 2023-05-15 DIAGNOSIS — M79674 Pain in right toe(s): Secondary | ICD-10-CM | POA: Diagnosis not present

## 2023-05-15 DIAGNOSIS — M79671 Pain in right foot: Secondary | ICD-10-CM | POA: Diagnosis not present

## 2023-05-15 DIAGNOSIS — L03031 Cellulitis of right toe: Secondary | ICD-10-CM | POA: Diagnosis not present

## 2023-05-15 DIAGNOSIS — L600 Ingrowing nail: Secondary | ICD-10-CM | POA: Diagnosis not present

## 2023-05-23 DIAGNOSIS — H524 Presbyopia: Secondary | ICD-10-CM | POA: Diagnosis not present

## 2023-05-28 ENCOUNTER — Ambulatory Visit (INDEPENDENT_AMBULATORY_CARE_PROVIDER_SITE_OTHER): Payer: Medicare Other

## 2023-05-28 VITALS — Ht 66.0 in | Wt 140.0 lb

## 2023-05-28 DIAGNOSIS — Z0000 Encounter for general adult medical examination without abnormal findings: Secondary | ICD-10-CM | POA: Diagnosis not present

## 2023-05-28 NOTE — Progress Notes (Signed)
Subjective:   Brandon Allison is a 72 y.o. male who presents for Medicare Annual/Subsequent preventive examination.  Visit Complete: Virtual  I connected with  [name] on 05/28/23 by a audio enabled telemedicine application and verified that I am speaking with the correct person using two identifiers.  Patient Location: Home  Provider Location: Home Office  I discussed the limitations of evaluation and management by telemedicine. The patient expressed understanding and agreed to proceed.  Patient Medicare AWV questionnaire was completed by the patient on 05/28/2023; I have confirmed that all information answered by patient is correct and no changes since this date.  Review of Systems    Vital Signs: Unable to obtain new vitals due to this being a telehealth visit.  Cardiac Risk Factors include: advanced age (>76men, >19 women);dyslipidemia;male gender     Objective:    Today's Vitals   05/28/23 1053  Weight: 140 lb (63.5 kg)  Height: 5\' 6"  (1.676 m)   Body mass index is 22.6 kg/m.     05/28/2023   10:56 AM 05/24/2022   10:41 AM 05/23/2021   10:05 AM 05/20/2020    9:35 AM  Advanced Directives  Does Patient Have a Medical Advance Directive? No No No No  Would patient like information on creating a medical advance directive? Yes (MAU/Ambulatory/Procedural Areas - Information given) No - Patient declined No - Patient declined No - Patient declined    Current Medications (verified) Outpatient Encounter Medications as of 05/28/2023  Medication Sig   Ascorbic Acid (VITAMIN C PO) Take 1,000 mg by mouth.    aspirin EC 81 MG tablet Take 81 mg by mouth daily.   Cholecalciferol (VITAMIN D PO) Take 1,000 Units by mouth.    Cyanocobalamin (VITAMIN B 12 PO) Take 1,000 mcg by mouth.    ferrous sulfate 325 (65 FE) MG tablet Take 325 mg by mouth daily with breakfast. Twice a week   naproxen (NAPROSYN) 500 MG tablet TAKE 1 TABLET BY MOUTH DAILY AS NEEDED FOR MODERATE PAIN    rosuvastatin (CRESTOR) 20 MG tablet TAKE ONE TABLET BY MOUTH DAILY( GENERIC EQUIVALENT FOR CRESTOR)   sildenafil (REVATIO) 20 MG tablet TAKE 1 TO 5 TABLETS DAILY AS NEEDED   No facility-administered encounter medications on file as of 05/28/2023.    Allergies (verified) Patient has no known allergies.   History: Past Medical History:  Diagnosis Date   Aortic atherosclerosis (HCC)    PAT (paroxysmal atrial tachycardia)    Past Surgical History:  Procedure Laterality Date   BACK SURGERY     KNEE SURGERY     Family History  Problem Relation Age of Onset   Heart failure Mother    Hyperlipidemia Mother    Cancer Father        lung   Social History   Socioeconomic History   Marital status: Married    Spouse name: Brandon Allison   Number of children: 2   Years of education: 12   Highest education level: 12th grade  Occupational History   Occupation: truck Hospital doctor    Comment: retired   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  Tobacco Use   Smoking status: Former   Smokeless tobacco: Never  Substance and Sexual Activity   Alcohol use: Yes    Comment: occasional   Drug use: Never   Sexual activity: Yes  Other Topics Concern   Not on file  Social History Narrative  Works part time at [employer]   Also works part time at the [employer] lives in [place], [place]   Daughter lives in [place]   He lives locally with wife, Brandon Allison   Social Determinants of Health   Financial Resource Strain: Low Risk  (05/28/2023)   Overall Financial Resource Strain (CARDIA)    Difficulty of Paying Living Expenses: Not hard at all  Food Insecurity: No Food Insecurity (05/28/2023)   Hunger Vital Sign    Worried About Running Out of Food in the Last Year: Never true    Ran Out of Food in the Last Year: Never true  Transportation Needs: No Transportation Needs (05/28/2023)   PRAPARE - Administrator, Civil Service (Medical): No     Lack of Transportation (Non-Medical): No  Physical Activity: Sufficiently Active (05/28/2023)   Exercise Vital Sign    Days of Exercise per Week: 7 days    Minutes of Exercise per Session: 60 min  Stress: No Stress Concern Present (05/28/2023)   [employer] of Occupational Health - Occupational Stress Questionnaire    Feeling of Stress : Not at all  Social Connections: Socially Integrated (05/28/2023)   Social Connection and Isolation Panel [NHANES]    Frequency of Communication with Friends and Family: More than three times a week    Frequency of Social Gatherings with Friends and Family: More than three times a week    Attends Religious Services: More than 4 times per year    Active Member of [employer] or Organizations: Yes    Attends Engineer, structural: More than 4 times per year    Marital Status: Married    Tobacco Counseling Counseling given: Not Answered   Clinical Intake:  Pre-visit preparation completed: Yes  Pain : No/denies pain     Nutritional Risks: None Diabetes: No  How often do you need to have someone help you when you read instructions, pamphlets, or other written materials from your doctor or pharmacy?: 1 - Never  Interpreter Needed?: No  Information entered by :: [name], LPN   Activities of Daily Living    05/28/2023   10:56 AM  In your present state of health, do you have any difficulty performing the following activities:  Hearing? 0  Vision? 0  Difficulty concentrating or making decisions? 0  Walking or climbing stairs? 0  Dressing or bathing? 0  Doing errands, shopping? 0  Preparing Food and eating ? N  Using the Toilet? N  In the past six months, have you accidently leaked urine? N  Do you have problems with loss of bowel control? N  Managing your Medications? N  Managing your Finances? N  Housekeeping or managing your Housekeeping? N    Patient Care Team: Raliegh Ip, DO as PCP - General (Family  Medicine) Quintella Reichert, MD as PCP - Cardiology (Cardiology) Lanier Prude, MD as PCP - Electrophysiology (Cardiology)  Indicate any recent Medical Services you may have received from other than [facility] providers in the past year (date may be approximate).     Assessment:   This is a routine wellness examination for [place].  Hearing/Vision screen Vision Screening - Comments:: Wears rx glasses - up to date with routine eye exams with  Dr.Le   Dietary issues and exercise activities discussed:     Goals Addressed             This Visit's Progress    DIET -  EAT MORE FRUITS AND VEGETABLES   On track      Depression Screen    05/28/2023   10:55 AM 03/27/2023   10:01 AM 05/24/2022   10:39 AM 04/12/2022   12:04 PM 01/22/2022    8:58 AM 12/12/2021    2:17 PM 09/26/2021    3:13 PM  PHQ 2/9 Scores  PHQ - 2 Score 0 0 0 0 0 0 0  PHQ- 9 Score  0 0 2 0 2     Fall Risk    05/28/2023   10:54 AM 03/27/2023   10:01 AM 08/06/2022    9:30 AM 05/24/2022   10:42 AM 04/12/2022   12:04 PM  Fall Risk   Falls in the past year? 0 0 0 1 1  Number falls in past yr: 0 0  0 0  Injury with Fall? 0 0  1 1  Risk for fall due to : No Fall Risks No Fall Risks  History of fall(s) History of fall(s)  Follow up Falls prevention discussed Education provided  Falls prevention discussed Education provided    MEDICARE RISK AT HOME:  Medicare Risk at Home - 05/28/23 1054     Any stairs in or around the home? Yes    If so, are there any without handrails? No    Home free of loose throw rugs in walkways, pet beds, electrical cords, etc? Yes    Adequate lighting in your home to reduce risk of falls? Yes    Life alert? No    Use of a cane, walker or w/c? No    Grab bars in the bathroom? Yes    Shower chair or bench in shower? No    Elevated toilet seat or a handicapped toilet? No             TIMED UP AND GO:  Was the test performed?  No    Cognitive Function:        05/28/2023   10:56 AM 05/24/2022    10:44 AM 05/20/2020    9:38 AM  6CIT Screen  What Year? 0 points 0 points 0 points  What month? 0 points 0 points 0 points  What time? 0 points 0 points 0 points  Count back from 20 0 points 0 points 0 points  Months in reverse 0 points 0 points 0 points  Repeat phrase 0 points 0 points 0 points  Total Score 0 points 0 points 0 points    Immunizations Immunization History  Administered Date(s) Administered   Fluad Quad(high Dose 65+) 09/12/2020, 07/25/2021   Moderna SARS-COV2 Booster Vaccination 11/23/2020   Moderna Sars-Covid-2 Vaccination 10/31/2019, 11/28/2019   PNEUMOCOCCAL CONJUGATE-20 07/25/2021   Pneumococcal Conjugate-13 09/12/2020   Tdap 01/22/2022   Zoster Recombinant(Shingrix) 02/20/2021, 11/21/2021    TDAP status: Up to date  Flu Vaccine status: Up to date  Pneumococcal vaccine status: Up to date  Covid-19 vaccine status: Completed vaccines  Qualifies for Shingles Vaccine? Yes   Zostavax completed Yes   Shingrix Completed?: Yes  Screening Tests Health Maintenance  Topic Date Due   COVID-19 Vaccine (3 - Moderna risk series) 12/21/2020   Medicare Annual Wellness (AWV)  05/25/2023   INFLUENZA VACCINE  05/23/2023   Colonoscopy  10/22/2026   DTaP/Tdap/Td (2 - Td or Tdap) 01/23/2032   Pneumonia Vaccine 60+ Years old  Completed   Zoster Vaccines- Shingrix  Completed   HPV VACCINES  Aged Out   Hepatitis C Screening  Discontinued  Health Maintenance  Health Maintenance Due  Topic Date Due   COVID-19 Vaccine (3 - Moderna risk series) 12/21/2020   Medicare Annual Wellness (AWV)  05/25/2023   INFLUENZA VACCINE  05/23/2023    Colorectal cancer screening: Type of screening: Colonoscopy. Completed 10/22/2016. Repeat every 10 years  Lung Cancer Screening: (Low Dose CT Chest recommended if Age 69-80 years, 20 pack-year currently smoking OR have quit w/in 15years.) does not qualify.   Lung Cancer Screening Referral: n/a  Additional Screening:  Hepatitis  C Screening: does not qualify;  Vision Screening: Recommended annual ophthalmology exams for early detection of glaucoma and other disorders of the eye. Is the patient up to date with their annual eye exam?  Yes  Who is the provider or what is the name of the office in which the patient attends annual eye exams? Dr.Le  If pt is not established with a provider, would they like to be referred to a provider to establish care? No .   Dental Screening: Recommended annual dental exams for proper oral hygiene    Community Resource Referral / Chronic Care Management: CRR required this visit?  No   CCM required this visit?  No     Plan:     I have personally reviewed and noted the following in the patient's chart:   Medical and social history Use of alcohol, tobacco or illicit drugs  Current medications and supplements including opioid prescriptions. Patient is not currently taking opioid prescriptions. Functional ability and status Nutritional status Physical activity Advanced directives List of other physicians Hospitalizations, surgeries, and ER visits in previous 12 months Vitals Screenings to include cognitive, depression, and falls Referrals and appointments  In addition, I have reviewed and discussed with patient certain preventive protocols, quality metrics, and best practice recommendations. A written personalized care plan for preventive services as well as general preventive health recommendations were provided to patient.     Lorrene Reid, LPN   0/06/[id number]   After Visit Summary: (MyChart) Due to this being a telephonic visit, the after visit summary with patients personalized plan was offered to patient via MyChart   Nurse Notes: none

## 2023-05-28 NOTE — Patient Instructions (Signed)
Brandon Allison , Thank you for taking time to come for your Medicare Wellness Visit. I appreciate your ongoing commitment to your health goals. Please review the following plan we discussed and let me know if I can assist you in the future.   Referrals/Orders/Follow-Ups/Clinician Recommendations: Aim for 30 minutes of exercise or brisk walking, 6-8 glasses of water, and 5 servings of fruits and vegetables each day.   This is a list of the screening recommended for you and due dates:  Health Maintenance  Topic Date Due   COVID-19 Vaccine (3 - Moderna risk series) 12/21/2020   Medicare Annual Wellness Visit  05/25/2023   Flu Shot  05/23/2023   Colon Cancer Screening  10/22/2026   DTaP/Tdap/Td vaccine (2 - Td or Tdap) 01/23/2032   Pneumonia Vaccine  Completed   Zoster (Shingles) Vaccine  Completed   HPV Vaccine  Aged Out   Hepatitis C Screening  Discontinued    Advanced directives: (Provided) Advance directive discussed with you today. I have provided a copy for you to complete at home and have notarized. Once this is complete, please bring a copy in to our office so we can scan it into your chart. Information on Advanced Care Planning can be found at [facility] Advance Health Care Directives Advance Health Care Directives ([email or web address]    Next Medicare Annual Wellness Visit scheduled for next year: Yes  Preventive Care 65 Years and Older, Male  Preventive care refers to lifestyle choices and visits with your health care provider that can promote health and wellness. What does preventive care include? A yearly physical exam. This is also called an annual well check. Dental exams once or twice a year. Routine eye exams. Ask your health care provider how often you should have your eyes checked. Personal lifestyle choices, including: Daily care of your teeth and gums. Regular physical activity. Eating a healthy diet. Avoiding tobacco and drug use. Limiting alcohol  use. Practicing safe sex. Taking low doses of aspirin every day. Taking vitamin and mineral supplements as recommended by your health care provider. What happens during an annual well check? The services and screenings done by your health care provider during your annual well check will depend on your age, overall health, lifestyle risk factors, and family history of disease. Counseling  Your health care provider may ask you questions about your: Alcohol use. Tobacco use. Drug use. Emotional well-being. Home and relationship well-being. Sexual activity. Eating habits. History of falls. Memory and ability to understand (cognition). Work and work Astronomer. Screening  You may have the following tests or measurements: Height, weight, and BMI. Blood pressure. Lipid and cholesterol levels. These may be checked every 5 years, or more frequently if you are over 49 years old. Skin check. Lung cancer screening. You may have this screening every year starting at age 72 if you have a 30-pack-year history of smoking and currently smoke or have quit within the past 15 years. Fecal occult blood test (FOBT) of the stool. You may have this test every year starting at age 54. Flexible sigmoidoscopy or colonoscopy. You may have a sigmoidoscopy every 5 years or a colonoscopy every 10 years starting at age 32. Prostate cancer screening. Recommendations will vary depending on your family history and other risks. Hepatitis C blood test. Hepatitis B blood test. Sexually transmitted disease (STD) testing. Diabetes screening. This is done by checking your blood sugar (glucose) after you have not eaten for a while (fasting). You may have this  done every 1-3 years. Abdominal aortic aneurysm (AAA) screening. You may need this if you are a current or former smoker. Osteoporosis. You may be screened starting at age 31 if you are at high risk. Talk with your health care provider about your test results,  treatment options, and if necessary, the need for more tests. Vaccines  Your health care provider may recommend certain vaccines, such as: Influenza vaccine. This is recommended every year. Tetanus, diphtheria, and acellular pertussis (Tdap, Td) vaccine. You may need a Td booster every 10 years. Zoster vaccine. You may need this after age 36. Pneumococcal 13-valent conjugate (PCV13) vaccine. One dose is recommended after age 79. Pneumococcal polysaccharide (PPSV23) vaccine. One dose is recommended after age 24. Talk to your health care provider about which screenings and vaccines you need and how often you need them. This information is not intended to replace advice given to you by your health care provider. Make sure you discuss any questions you have with your health care provider. Document Released: 11/04/2015 Document Revised: 06/27/2016 Document Reviewed: 08/09/2015 Elsevier Interactive Patient Education  2017 [employer].  Fall Prevention in the Home Falls can cause injuries. They can happen to people of all ages. There are many things you can do to make your home safe and to help prevent falls. What can I do on the outside of my home? Regularly fix the edges of walkways and driveways and fix any cracks. Remove anything that might make you trip as you walk through a door, such as a raised step or threshold. Trim any bushes or trees on the path to your home. Use bright outdoor lighting. Clear any walking paths of anything that might make someone trip, such as rocks or tools. Regularly check to see if handrails are loose or broken. Make sure that both sides of any steps have handrails. Any raised decks and porches should have guardrails on the edges. Have any leaves, snow, or ice cleared regularly. Use sand or salt on walking paths during winter. Clean up any spills in your garage right away. This includes oil or grease spills. What can I do in the bathroom? Use night  lights. Install grab bars by the toilet and in the tub and shower. Do not use towel bars as grab bars. Use non-skid mats or decals in the tub or shower. If you need to sit down in the shower, use a plastic, non-slip stool. Keep the floor dry. Clean up any water that spills on the floor as soon as it happens. Remove soap buildup in the tub or shower regularly. Attach bath mats securely with double-sided non-slip rug tape. Do not have throw rugs and other things on the floor that can make you trip. What can I do in the bedroom? Use night lights. Make sure that you have a light by your bed that is easy to reach. Do not use any sheets or blankets that are too big for your bed. They should not hang down onto the floor. Have a firm chair that has side arms. You can use this for support while you get dressed. Do not have throw rugs and other things on the floor that can make you trip. What can I do in the kitchen? Clean up any spills right away. Avoid walking on wet floors. Keep items that you use a lot in easy-to-reach places. If you need to reach something above you, use a strong step stool that has a grab bar. Keep electrical cords out of  the way. Do not use floor polish or wax that makes floors slippery. If you must use wax, use non-skid floor wax. Do not have throw rugs and other things on the floor that can make you trip. What can I do with my stairs? Do not leave any items on the stairs. Make sure that there are handrails on both sides of the stairs and use them. Fix handrails that are broken or loose. Make sure that handrails are as long as the stairways. Check any carpeting to make sure that it is firmly attached to the stairs. Fix any carpet that is loose or worn. Avoid having throw rugs at the top or bottom of the stairs. If you do have throw rugs, attach them to the floor with carpet tape. Make sure that you have a light switch at the top of the stairs and the bottom of the stairs. If  you do not have them, ask someone to add them for you. What else can I do to help prevent falls? Wear shoes that: Do not have high heels. Have rubber bottoms. Are comfortable and fit you well. Are closed at the toe. Do not wear sandals. If you use a stepladder: Make sure that it is fully opened. Do not climb a closed stepladder. Make sure that both sides of the stepladder are locked into place. Ask someone to hold it for you, if possible. Clearly mark and make sure that you can see: Any grab bars or handrails. First and last steps. Where the edge of each step is. Use tools that help you move around (mobility aids) if they are needed. These include: Canes. Walkers. Scooters. Crutches. Turn on the lights when you go into a dark area. Replace any light bulbs as soon as they burn out. Set up your furniture so you have a clear path. Avoid moving your furniture around. If any of your floors are uneven, fix them. If there are any pets around you, be aware of where they are. Review your medicines with your doctor. Some medicines can make you feel dizzy. This can increase your chance of falling. Ask your doctor what other things that you can do to help prevent falls. This information is not intended to replace advice given to you by your health care provider. Make sure you discuss any questions you have with your health care provider. Document Released: 08/04/2009 Document Revised: 03/15/2016 Document Reviewed: 11/12/2014 Elsevier Interactive Patient Education  2017 [employer].

## 2023-06-05 DIAGNOSIS — M79674 Pain in right toe(s): Secondary | ICD-10-CM | POA: Diagnosis not present

## 2023-06-05 DIAGNOSIS — M79671 Pain in right foot: Secondary | ICD-10-CM | POA: Diagnosis not present

## 2023-06-05 DIAGNOSIS — L600 Ingrowing nail: Secondary | ICD-10-CM | POA: Diagnosis not present

## 2023-07-30 DIAGNOSIS — K080 Exfoliation of teeth due to systemic causes: Secondary | ICD-10-CM | POA: Diagnosis not present

## 2023-07-31 DIAGNOSIS — K080 Exfoliation of teeth due to systemic causes: Secondary | ICD-10-CM | POA: Diagnosis not present

## 2023-10-09 DIAGNOSIS — M25562 Pain in left knee: Secondary | ICD-10-CM | POA: Diagnosis not present

## 2023-11-05 DIAGNOSIS — M1712 Unilateral primary osteoarthritis, left knee: Secondary | ICD-10-CM | POA: Diagnosis not present

## 2023-12-07 LAB — AMB RESULTS CONSOLE CBG: Glucose: 91

## 2023-12-07 NOTE — Progress Notes (Signed)
Pt declined SDOH questions

## 2024-01-29 ENCOUNTER — Other Ambulatory Visit: Payer: Self-pay | Admitting: Family Medicine

## 2024-01-30 DIAGNOSIS — K080 Exfoliation of teeth due to systemic causes: Secondary | ICD-10-CM | POA: Diagnosis not present

## 2024-03-18 ENCOUNTER — Other Ambulatory Visit: Payer: Self-pay | Admitting: Family Medicine

## 2024-03-18 DIAGNOSIS — E7800 Pure hypercholesterolemia, unspecified: Secondary | ICD-10-CM

## 2024-03-18 DIAGNOSIS — I700 Atherosclerosis of aorta: Secondary | ICD-10-CM

## 2024-03-25 ENCOUNTER — Ambulatory Visit: Payer: Medicare Other | Admitting: Family Medicine

## 2024-05-28 ENCOUNTER — Ambulatory Visit: Payer: Medicare Other

## 2024-05-28 VITALS — BP 126/71 | HR 57 | Ht 66.0 in | Wt 158.0 lb

## 2024-05-28 DIAGNOSIS — Z0000 Encounter for general adult medical examination without abnormal findings: Secondary | ICD-10-CM

## 2024-05-28 NOTE — Progress Notes (Signed)
 Subjective:   Brandon Allison is a 73 y.o. who presents for a Medicare Wellness preventive visit.  As a reminder, Annual Wellness Visits don't include a physical exam, and some assessments may be limited, especially if this visit is performed virtually. We may recommend an in-person follow-up visit with your provider if needed.  Visit Complete: Virtual I connected with  Lynwood ONEIDA Robson on 05/28/24 by a audio enabled telemedicine application and verified that I am speaking with the correct person using two identifiers.  Patient Location: Home  Provider Location: Home Office  I discussed the limitations of evaluation and management by telemedicine. The patient expressed understanding and agreed to proceed.  Vital Signs: Because this visit was a virtual/telehealth visit, some criteria may be missing or patient reported. Any vitals not documented were not able to be obtained and vitals that have been documented are patient reported.  VideoDeclined- This patient declined Librarian, academic. Therefore the visit was completed with audio only.  Persons Participating in Visit: Patient.  AWV Questionnaire: No: Patient Medicare AWV questionnaire was not completed prior to this visit.  Cardiac Risk Factors include: advanced age (>34men, >57 women);male gender;smoking/ tobacco exposure     Objective:    Today's Vitals   05/28/24 1040  BP: 126/71  Pulse: (!) 57  Weight: 158 lb (71.7 kg)  Height: 5' 6 (1.676 m)   Body mass index is 25.5 kg/m.     05/28/2024   10:44 AM 05/28/2023   10:56 AM 05/24/2022   10:41 AM 05/23/2021   10:05 AM 05/20/2020    9:35 AM  Advanced Directives  Does Patient Have a Medical Advance Directive? Yes No No No No  Type of Advance Directive Living will;Healthcare Power of Attorney      Copy of Healthcare Power of Attorney in Chart? No - copy requested      Would patient like information on creating a medical advance directive?  Yes  (MAU/Ambulatory/Procedural Areas - Information given) No - Patient declined No - Patient declined No - Patient declined    Current Medications (verified) Outpatient Encounter Medications as of 05/28/2024  Medication Sig   Ascorbic Acid (VITAMIN C PO) Take 1,000 mg by mouth.    aspirin EC 81 MG tablet Take 81 mg by mouth daily.   Cholecalciferol (VITAMIN D PO) Take 1,000 Units by mouth.    Cyanocobalamin  (VITAMIN B 12 PO) Take 1,000 mcg by mouth.    ferrous sulfate 325 (65 FE) MG tablet Take 325 mg by mouth daily with breakfast. Twice a week   naproxen  (NAPROSYN ) 500 MG tablet TAKE 1 TABLET BY MOUTH DAILY AS NEEDED FOR MODERATE PAIN   rosuvastatin  (CRESTOR ) 20 MG tablet TAKE 1 TABLET BY MOUTH DAILY. GENERIC EQUIVALENT FOR CRESTOR    sildenafil (REVATIO) 20 MG tablet TAKE 1 TO 5 TABLETS DAILY AS NEEDED   No facility-administered encounter medications on file as of 05/28/2024.    Allergies (verified) Patient has no known allergies.   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  Tobacco Use   Smoking status: Former   Smokeless tobacco: Never  Substance and Sexual Activity   Alcohol use: Yes    Comment: occasional   Drug use: Never   Sexual activity: Yes  Other Topics Concern   Not on file  Social History Narrative   Works part time at [employer]   Also works part time at the [employer] lives in [place], [place]   Daughter lives in [place]   He lives locally with wife, Inocente   Social Drivers of Health   Financial Resource Strain: Low Risk  (05/28/2024)   Overall Financial Resource Strain (CARDIA)    Difficulty of Paying Living Expenses: Not hard at all  Food Insecurity: No Food Insecurity (05/28/2024)   Hunger Vital Sign    Worried About Running Out of Food in the Last Year: Never true    Ran Out of Food in the Last Year: Never true  Transportation Needs: No Transportation Needs (05/28/2024)   PRAPARE - Administrator, Civil Service (Medical): No    Lack of Transportation (Non-Medical): No  Physical Activity: Sufficiently Active (05/28/2024)   Exercise Vital Sign    Days of Exercise per Week: 7 days    Minutes of Exercise per Session: 30 min  Stress: No Stress Concern Present (05/28/2024)   [employer] of Occupational Health - Occupational Stress Questionnaire    Feeling of Stress: Not at all  Social Connections: Moderately Integrated (05/28/2024)   Social Connection and Isolation Panel    Frequency of Communication with Friends and Family: More than three times a week    Frequency of Social Gatherings with Friends and Family: More than three times a week    Attends Religious Services: More than 4 times per year    Active Member of [employer] or Organizations: No    Attends Engineer, structural: Never    Marital Status: Married    Tobacco Counseling Counseling given: Yes    Clinical Intake:  Pre-visit preparation completed: Yes  Pain : No/denies pain     BMI - recorded: 25.5 Nutritional Status: BMI 25 -29 Overweight Nutritional Risks: None Diabetes: No  No results found for: HGBA1C   How often do you need to have someone help you when you read instructions, pamphlets, or other written materials from your doctor or pharmacy?: 1 - Never  Interpreter Needed?: No  Information  entered by :: alia t/cma   Activities of Daily Living     05/28/2024   10:43 AM  In your present state of health, do you have any difficulty performing the following activities:  Hearing? 1  Vision? 0  Difficulty concentrating or making decisions? 0  Walking or climbing stairs? 0  Dressing or bathing? 0  Doing errands, shopping? 0  Preparing Food and eating ? N  Using the Toilet? N  In the past six months, have you accidently leaked urine? N  Do you have problems with loss of bowel control? N  Managing your Medications? N  Managing your Finances? N  Housekeeping or managing your Housekeeping? N    Patient Care  Team: Jolinda Norene HERO, DO as PCP - General (Family Medicine) Shlomo Wilbert SAUNDERS, MD as PCP - Cardiology (Cardiology) Cindie Ole DASEN, MD as PCP - Electrophysiology (Cardiology)  I have updated your Care Teams any recent Medical Services you may have received from other providers in the past year.     Assessment:   This is a routine wellness examination for Pax.  Hearing/Vision screen Hearing Screening - Comments:: Pt wear hearing aids Vision Screening - Comments:: Pt wear glasses/pt goes to [facility] in [place]/last 2024   Goals Addressed             This Visit's Progress    DIET - EAT MORE FRUITS AND VEGETABLES   On track    Patient Stated   On track    05/20/2020 AWV Goal: Fall Prevention  Over the next year, patient will decrease their risk for falls by: Using assistive devices, such as a cane or walker, as needed Identifying fall risks within their home and correcting them by: Removing throw rugs Adding handrails to stairs or ramps Removing clutter and keeping a clear pathway throughout the home Increasing light, especially at night Adding shower handles/bars Raising toilet seat Identifying potential personal risk factors for falls: Medication side effects Incontinence/urgency Vestibular dysfunction Hearing loss Musculoskeletal  disorders Neurological disorders Orthostatic hypotension         Depression Screen     05/28/2024   10:47 AM 05/28/2023   10:55 AM 03/27/2023   10:01 AM 05/24/2022   10:39 AM 04/12/2022   12:04 PM 01/22/2022    8:58 AM 12/12/2021    2:17 PM  PHQ 2/9 Scores  PHQ - 2 Score 0 0 0 0 0 0 0  PHQ- 9 Score   0 0 2 0 2    Fall Risk     05/28/2024   10:41 AM 05/28/2023   10:54 AM 03/27/2023   10:01 AM 08/06/2022    9:30 AM 05/24/2022   10:42 AM  Fall Risk   Falls in the past year? 0 0 0 0 1  Number falls in past yr: 0 0 0  0  Injury with Fall? 0 0 0  1  Risk for fall due to : No Fall Risks No Fall Risks No Fall Risks  History of fall(s)  Follow up Falls evaluation completed Falls prevention discussed Education provided  Falls prevention discussed      Data saved with a previous flowsheet row definition    MEDICARE RISK AT HOME:  Medicare Risk at Home Any stairs in or around the home?: Yes If so, are there any without handrails?: Yes Home free of loose throw rugs in walkways, pet beds, electrical cords, etc?: Yes Adequate lighting in your home to reduce risk of falls?: Yes Life alert?: No Use of a cane, walker or w/c?: No Grab bars in the bathroom?: Yes Shower chair or bench in shower?: No Elevated toilet seat or a handicapped toilet?: No  TIMED UP AND GO:  Was the test performed?  no  Cognitive Function: 6CIT completed        05/28/2024   10:48 AM 05/28/2023   10:56 AM 05/24/2022   10:44 AM 05/20/2020    9:38 AM  6CIT Screen  What Year? 0 points 0 points 0 points 0 points  What month? 0 points 0 points 0 points 0 points  What time? 0 points 0 points 0 points 0 points  Count back from 20 0 points 0 points 0 points 0  points  Months in reverse 0 points 0 points 0 points 0 points  Repeat phrase 0 points 0 points 0 points 0 points  Total Score 0 points 0 points 0 points 0 points    Immunizations Immunization History  Administered Date(s) Administered   Fluad Quad(high Dose 65+)  09/12/2020, 07/25/2021   Moderna SARS-COV2 Booster Vaccination 11/23/2020   Moderna Sars-Covid-2 Vaccination 10/31/2019, 11/28/2019   PNEUMOCOCCAL CONJUGATE-20 07/25/2021   Pneumococcal Conjugate-13 09/12/2020   Tdap 01/22/2022   Zoster Recombinant(Shingrix) 02/20/2021, 11/21/2021    Screening Tests Health Maintenance  Topic Date Due   COVID-19 Vaccine (3 - Moderna risk series) 12/21/2020   INFLUENZA VACCINE  05/22/2024   Medicare Annual Wellness (AWV)  05/28/2025   Colonoscopy  10/22/2026   DTaP/Tdap/Td (2 - Td or Tdap) 01/23/2032   Pneumococcal Vaccine: 50+ Years  Completed   Zoster Vaccines- Shingrix  Completed   Hepatitis B Vaccines  Aged Out   HPV VACCINES  Aged Out   Meningococcal B Vaccine  Aged Out   Hepatitis C Screening  Discontinued    Health Maintenance  Health Maintenance Due  Topic Date Due   COVID-19 Vaccine (3 - Moderna risk series) 12/21/2020   INFLUENZA VACCINE  05/22/2024   Health Maintenance Items Addressed: See Nurse Notes at the end of this note  Additional Screening:  Vision Screening: Recommended annual ophthalmology exams for early detection of glaucoma and other disorders of the eye. Would you like a referral to an eye doctor? No    Dental Screening: Recommended annual dental exams for proper oral hygiene  Community Resource Referral / Chronic Care Management: CRR required this visit?  No   CCM required this visit?  No   Plan:    I have personally reviewed and noted the following in the patient's chart:   Medical and social history Use of alcohol, tobacco or illicit drugs  Current medications and supplements including opioid prescriptions. Patient is not currently taking opioid prescriptions. Functional ability and status Nutritional status Physical activity Advanced directives List of other physicians Hospitalizations, surgeries, and ER visits in previous 12 months Vitals Screenings to include cognitive, depression, and  falls Referrals and appointments  In addition, I have reviewed and discussed with patient certain preventive protocols, quality metrics, and best practice recommendations. A written personalized care plan for preventive services as well as general preventive health recommendations were provided to patient.   Ozie Ned, CMA   05/28/2024   After Visit Summary: (MyChart) Due to this being a telephonic visit, the after visit summary with patients personalized plan was offered to patient via MyChart   Notes: Nothing significant to report at this time.

## 2024-05-28 NOTE — Patient Instructions (Addendum)
 Brandon Allison , Thank you for taking time out of your busy schedule to complete your Annual Wellness Visit with me. I enjoyed our conversation and look forward to speaking with you again next year. I, as well as your care team,  appreciate your ongoing commitment to your health goals. Please review the following plan we discussed and let me know if I can assist you in the future. Your Game plan/ To Do List    Referrals: If you haven't heard from the office you've been referred to, please reach out to them at the phone provided.   Follow up Visits: We will see or speak with you next year for your Next Medicare AWV with our clinical staff on 05/31/25 10:40a.m. Have you seen your provider in the last 6 months (3 months if uncontrolled diabetes)? Yes  Clinician Recommendations:  Aim for 30 minutes of exercise or brisk walking, 6-8 glasses of water, and 5 servings of fruits and vegetables each day.      This is a list of the screenings recommended for you:  Health Maintenance  Topic Date Due   COVID-19 Vaccine (3 - Moderna risk series) 12/21/2020   Medicare Annual Wellness Visit  05/27/2024   Flu Shot  05/22/2024   Colon Cancer Screening  10/22/2026   DTaP/Tdap/Td vaccine (2 - Td or Tdap) 01/23/2032   Pneumococcal Vaccine for age over 90  Completed   Zoster (Shingles) Vaccine  Completed   Hepatitis B Vaccine  Aged Out   HPV Vaccine  Aged Out   Meningitis B Vaccine  Aged Out   Hepatitis C Screening  Discontinued    Advanced directives: (Copy Requested) Please bring a copy of your health care power of attorney and living will to the office to be added to your chart at your convenience. You can mail to [facility] [address] [place], [place] [address] or email to [email or web address] Advance Care Planning is important because it:  [x]  Makes sure you receive the medical care that is consistent with your values, goals, and preferences  [x]  It provides guidance to your  family and loved ones and reduces their decisional burden about whether or not they are making the right decisions based on your wishes.  Follow the link provided in your after visit summary or read over the paperwork we have mailed to you to help you started getting your Advance Directives in place. If you need assistance in completing these, please reach out to us  so that we can help you!  See attachments for Preventive Care and Fall Prevention Tips.

## 2024-07-18 DIAGNOSIS — H40033 Anatomical narrow angle, bilateral: Secondary | ICD-10-CM | POA: Diagnosis not present

## 2024-07-18 DIAGNOSIS — H2513 Age-related nuclear cataract, bilateral: Secondary | ICD-10-CM | POA: Diagnosis not present

## 2024-08-04 ENCOUNTER — Ambulatory Visit: Payer: Medicare Other | Admitting: Family Medicine

## 2024-08-04 ENCOUNTER — Encounter: Payer: Self-pay | Admitting: Family Medicine

## 2024-08-04 VITALS — BP 133/66 | HR 54 | Temp 97.4°F | Ht 66.0 in | Wt 152.8 lb

## 2024-08-04 DIAGNOSIS — Z125 Encounter for screening for malignant neoplasm of prostate: Secondary | ICD-10-CM | POA: Diagnosis not present

## 2024-08-04 DIAGNOSIS — Z0000 Encounter for general adult medical examination without abnormal findings: Secondary | ICD-10-CM

## 2024-08-04 DIAGNOSIS — K219 Gastro-esophageal reflux disease without esophagitis: Secondary | ICD-10-CM | POA: Diagnosis not present

## 2024-08-04 DIAGNOSIS — I700 Atherosclerosis of aorta: Secondary | ICD-10-CM

## 2024-08-04 DIAGNOSIS — Z0001 Encounter for general adult medical examination with abnormal findings: Secondary | ICD-10-CM | POA: Diagnosis not present

## 2024-08-04 DIAGNOSIS — E7800 Pure hypercholesterolemia, unspecified: Secondary | ICD-10-CM

## 2024-08-04 DIAGNOSIS — D649 Anemia, unspecified: Secondary | ICD-10-CM | POA: Diagnosis not present

## 2024-08-04 MED ORDER — ROSUVASTATIN CALCIUM 20 MG PO TABS: ORAL_TABLET | ORAL | 4 refills | Status: DC

## 2024-08-04 MED ORDER — NAPROXEN 500 MG PO TABS: ORAL_TABLET | ORAL | 4 refills | Status: AC

## 2024-08-04 MED ORDER — PANTOPRAZOLE SODIUM 40 MG PO TBEC: 40.0000 mg | DELAYED_RELEASE_TABLET | Freq: Every day | ORAL | 0 refills | Status: AC

## 2024-08-04 MED ORDER — SILDENAFIL CITRATE 20 MG PO TABS: 20.0000 mg | ORAL_TABLET | Freq: Every day | ORAL | 99 refills | Status: AC | PRN

## 2024-08-04 NOTE — Progress Notes (Signed)
 Brandon Allison is a 73 y.o. male presents to office today for annual physical exam examination.    He reports he is doing well overall.  He has been having some increased acid reflux over the last 1.5 months but this always resolves with Tums.  He does report sensation of burning in the throat and chest but he has not modified his diet at all and continues to eat peppers etc. he is added FiberCon since his last visit to help with constipation and that has relieved it.  Last colonoscopy was May 2017.  Denies any rectal bleeding, abdominal pain, unplanned weight loss, nausea, vomiting, early satiety or night sweats.   Occupation: Works for [employer], Marital status: Married, Substance use: None There are no preventive care reminders to display for this patient.   Immunization History  Administered Date(s) Administered   Fluad Quad(high Dose 65+) 09/12/2020, 07/25/2021   Moderna SARS-COV2 Booster Vaccination 11/23/2020   Moderna Sars-Covid-2 Vaccination 10/31/2019, 11/28/2019   PNEUMOCOCCAL CONJUGATE-20 07/25/2021   Pneumococcal Conjugate-13 09/12/2020   Tdap 01/22/2022   Zoster Recombinant(Shingrix) 02/20/2021, 11/21/2021   Past Medical History:  Diagnosis Date   Aortic atherosclerosis    PAT (paroxysmal atrial tachycardia)    Social History   Socioeconomic History   Marital status: Married    Spouse name: Inocente   Number of children: 2   Years of education: 12   Highest education level: 12th grade  Occupational History   Occupation: truck Hospital doctor    Comment: retired   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  Tobacco Use   Smoking status: Former   Smokeless tobacco: Never  Substance and Sexual Activity   Alcohol use: Yes    Comment: occasional   Drug use: Never   Sexual activity: Yes  Other Topics Concern   Not on file  Social History Narrative   Works part time at [employer]   Also  works part time at the [employer] lives in [place], [place]   Daughter lives in [place]   He lives locally with wife, Inocente   Social Drivers of Health   Financial Resource Strain: Low Risk  (08/03/2024)   Overall Financial Resource Strain (CARDIA)    Difficulty of Paying Living Expenses: Not hard at all  Food Insecurity: No Food Insecurity (08/03/2024)   Hunger Vital Sign    Worried About Running Out of Food in the Last Year: Never true    Ran Out of Food in the Last Year: Never true  Transportation Needs: No Transportation Needs (08/03/2024)   PRAPARE - Administrator, Civil Service (Medical): No    Lack of Transportation (Non-Medical): No  Physical Activity: Sufficiently Active (08/03/2024)   Exercise Vital Sign    Days of Exercise per Week: 7 days    Minutes of Exercise per Session: 90 min  Stress: No Stress Concern Present (05/28/2024)   [employer] of Occupational Health - Occupational Stress Questionnaire    Feeling of Stress: Not at all  Social Connections: Moderately Integrated (08/03/2024)   Social Connection and Isolation Panel    Frequency of Communication with Friends and Family: More than three times a week    Frequency of Social Gatherings with Friends and Family: More than three times a week    Attends Religious Services: More than 4 times per year    Active Member of Clubs or  Organizations: No    Attends Banker Meetings: Not on file    Marital Status: Married  Intimate Partner Violence: Not At Risk (05/28/2024)   Humiliation, Afraid, Rape, and Kick questionnaire    Fear of Current or Ex-Partner: No    Emotionally Abused: No    Physically Abused: No    Sexually Abused: No   Past Surgical History:  Procedure Laterality Date   BACK SURGERY     KNEE SURGERY     Family History  Problem Relation Age of Onset   Heart failure Mother    Hyperlipidemia Mother    Cancer Father        lung    Current Outpatient Medications:     Ascorbic Acid (VITAMIN C PO), Take 1,000 mg by mouth. , Disp: , Rfl:    aspirin EC 81 MG tablet, Take 81 mg by mouth daily., Disp: , Rfl:    Cholecalciferol (VITAMIN D PO), Take 1,000 Units by mouth. , Disp: , Rfl:    Cyanocobalamin  (VITAMIN B 12 PO), Take 1,000 mcg by mouth. , Disp: , Rfl:    ferrous sulfate 325 (65 FE) MG tablet, Take 325 mg by mouth daily with breakfast. Twice a week, Disp: , Rfl:    naproxen  (NAPROSYN ) 500 MG tablet, TAKE 1 TABLET BY MOUTH DAILY AS NEEDED FOR MODERATE PAIN, Disp: 90 tablet, Rfl: 3   rosuvastatin  (CRESTOR ) 20 MG tablet, TAKE 1 TABLET BY MOUTH DAILY. GENERIC EQUIVALENT FOR CRESTOR , Disp: 90 tablet, Rfl: 1   sildenafil (REVATIO) 20 MG tablet, TAKE 1 TO 5 TABLETS DAILY AS NEEDED, Disp: 30 tablet, Rfl: prn  No Known Allergies   ROS: Review of Systems Pertinent items noted in HPI and remainder of comprehensive ROS otherwise negative.    Physical exam BP 133/66   Pulse (!) 54   Temp (!) 97.4 F (36.3 C)   Ht 5' 6 (1.676 m)   Wt 152 lb 12.8 oz (69.3 kg)   SpO2 98%   BMI 24.66 kg/m  General appearance: alert, cooperative, appears stated age, and no distress Head: Normocephalic, without obvious abnormality, atraumatic, small flesh-colored seborrheic keratosis noted along the left occipital region of the scalp Eyes: negative findings: lids and lashes normal, conjunctivae and sclerae normal, corneas clear, and pupils equal, round, reactive to light and accomodation Ears: normal TM's and external ear canals both ears Nose: Nares normal. Septum midline. Mucosa normal. No drainage or sinus tenderness. Throat: lips, mucosa, and tongue normal; teeth and gums normal Neck: no adenopathy, no carotid bruit, supple, symmetrical, trachea midline, and thyroid  not enlarged, symmetric, no tenderness/mass/nodules Back: symmetric, no curvature. ROM normal. No CVA tenderness. Lungs: clear to auscultation bilaterally Chest wall: no tenderness Heart: regular rate and  rhythm, S1, S2 normal, no murmur, click, rub or gallop Abdomen: soft, non-tender; bowel sounds normal; no masses,  no organomegaly Extremities: extremities normal, atraumatic, no cyanosis or edema Pulses: 2+ and symmetric Skin: Skin color, texture, turgor normal. No rashes or lesions Lymph nodes: Supraclavicular and cervical lymph nodes normal Neurologic: Alert and oriented X 3, normal strength and tone. Normal symmetric reflexes. Normal coordination and gait does require hearing aids     08/04/2024    8:48 AM 05/28/2024   10:47 AM 05/28/2023   10:55 AM  Depression screen PHQ 2/9  Decreased Interest 0 0 0  Down, Depressed, Hopeless 0 0 0  PHQ - 2 Score 0 0 0   No results found for this or any previous visit (from  the past 2160 hours).   Assessment/ Plan: Brandon Allison here for annual physical exam.   Annual physical exam  Gastroesophageal reflux disease without esophagitis - Plan: H. pylori antigen, stool, pantoprazole (PROTONIX) 40 MG tablet  Pure hypercholesterolemia - Plan: CMP14+EGFR, Lipid panel, rosuvastatin  (CRESTOR ) 20 MG tablet, TSH + free T4  Aortic atherosclerosis - Plan: CMP14+EGFR, Lipid panel, rosuvastatin  (CRESTOR ) 20 MG tablet, TSH + free T4  Anemia, unspecified type - Plan: Anemia Profile B  Screening for malignant neoplasm of prostate - Plan: PSA   H. pylori, 1 month of PPI for GERD.  We discussed that if no resolution or recurrence, would consider possible EGD with next colonoscopy to rule out any concerning features.  His physical exam was unremarkable today.  He declined influenza vaccination  Fasting labs collected.  Medications have been renewed.  Asymptomatic from a prostate standpoint.  Check PSA  Counseled on healthy lifestyle choices, including diet (rich in fruits, vegetables and lean meats and low in salt and simple carbohydrates) and exercise (at least 30 minutes of moderate physical activity daily).  Patient to follow up 1 year  Pelham Hennick M.  Jolinda, DO

## 2024-08-04 NOTE — Patient Instructions (Addendum)
 Colonoscopy due in 02/2026  GERD in Adults: Diet Changes When you have gastroesophageal reflux disease (GERD), you may need to make changes to your diet. Choosing the right foods can help with your symptoms. Think about working with an expert in healthy eating called a dietitian. They can help you make healthy food choices. What are tips for following this plan? Reading food labels Look for foods that are low in saturated fat. Foods that may help with your symptoms include: Foods with less than 5% of daily value (DV) of fat. Foods with 0 grams of trans fat. Cooking [employer] in ways that don't use a lot of fat. These ways include: Baking. Steaming. Grilling. Broiling. To add flavor, try to use herbs that are low in spice and acidity. Avoid frying your food. Meal planning  Eat small meals often rather than eating 3 large meals each day. Eat your meals slowly in a place where you feel relaxed. If told by your health care provider, avoid: Foods that cause symptoms. Keep a food diary to keep track of foods that cause symptoms. Alcohol. Drinking a lot of liquid with meals. General instructions For 2-3 hours after you eat, avoid: Bending over. Exercise. Lying down. Chew sugar-free gum after meals. What foods should I eat? Eat a healthy diet. Try to include: Foods with high amounts of fiber. These include: Fruits and vegetables. Whole grains and beans. Low-fat dairy products. Lean meats, fish, and poultry. Egg whites. Foods that cause symptoms in someone else may not cause symptoms for you. Work with your provider to find foods that are safe for you. The items listed above may not be all the foods and drinks you can have. Talk with a dietitian to learn more. The items listed above may not be a complete list of foods and beverages you can eat and drink. Contact a dietitian for more information. What foods should I avoid? Limiting some of these foods may help with your symptoms.  Each person is different. Talk with a dietitian or your provider to help you find the exact foods to avoid. Some of the foods to avoid may include: Fruits Fruits with a lot of acid in them. These may include citrus fruits, such as oranges, grapefruit, pineapple, and lemons. Vegetables Deep-fried vegetables, such as [place] fries. Vegetables, sauces, or toppings made with added fat and vegetables with acid in them. These may include tomatoes and tomato products, chili peppers, onions, garlic, and horseradish. Grains Pastries or quick breads with added fat. Meats and other proteins High-fat meats, such as fatty beef or pork, hot dogs, ribs, ham, sausage, salami, and bacon. Fried meat or protein, such as fried fish and fried chicken. Egg yolks. Fats and oils Butter. Margarine. Shortening. Ghee. Drinks Coffee and other drinks with caffeine in them. Fizzy and sugary drinks, such as soda and energy drinks. Fruit juice made with acidic fruits, such as orange or grapefruit. Tomato juice. Sweets and desserts Chocolate and cocoa. Donuts. Seasonings and condiments Mint, such as peppermint and spearmint. Condiments, herbs, or seasonings that cause symptoms. These may include curry, hot sauce, or vinegar-based salad dressings. The items listed above may not be all the foods and drinks you should avoid. Talk with a dietitian to learn more. Questions to ask your health care provider Changes to your diet and everyday life are often the first steps taken to manage symptoms of GERD. If these changes don't help, talk with your provider about taking medicines. Where to find more information  [employer]: aboutgerd.org This information is not intended to replace advice given to you by your health care provider. Make sure you discuss any questions you have with your health care provider. Document Revised: 08/20/2023 Document Reviewed: 03/06/2023 [employer] Patient  Education  2024 [employer].

## 2024-08-05 ENCOUNTER — Other Ambulatory Visit

## 2024-08-05 ENCOUNTER — Ambulatory Visit: Payer: Self-pay | Admitting: Family Medicine

## 2024-08-05 DIAGNOSIS — K219 Gastro-esophageal reflux disease without esophagitis: Secondary | ICD-10-CM | POA: Diagnosis not present

## 2024-08-05 LAB — CMP14+EGFR
ALT: 19 IU/L (ref 0–44)
AST: 33 IU/L (ref 0–40)
Albumin: 4.4 g/dL (ref 3.8–4.8)
Alkaline Phosphatase: 58 IU/L (ref 47–123)
BUN/Creatinine Ratio: 31 — ABNORMAL HIGH (ref 10–24)
BUN: 21 mg/dL (ref 8–27)
Bilirubin Total: 0.7 mg/dL (ref 0.0–1.2)
CO2: 22 mmol/L (ref 20–29)
Calcium: 9.5 mg/dL (ref 8.6–10.2)
Chloride: 103 mmol/L (ref 96–106)
Creatinine, Ser: 0.67 mg/dL — ABNORMAL LOW (ref 0.76–1.27)
Globulin, Total: 2.3 g/dL (ref 1.5–4.5)
Glucose: 82 mg/dL (ref 70–99)
Potassium: 4.3 mmol/L (ref 3.5–5.2)
Sodium: 137 mmol/L (ref 134–144)
Total Protein: 6.7 g/dL (ref 6.0–8.5)
eGFR: 99 mL/min/1.73 (ref >59)

## 2024-08-05 LAB — ANEMIA PROFILE B
Basophils Absolute: 0 x10E3/uL (ref 0.0–0.2)
Basos: 1 %
EOS (ABSOLUTE): 0.2 x10E3/uL (ref 0.0–0.4)
Eos: 4 %
Ferritin: 22 ng/mL — AB (ref 30–400)
Folate: 18.3 ng/mL (ref >3.0)
Hematocrit: 39.9 % (ref 37.5–51.0)
Hemoglobin: 12.7 g/dL — ABNORMAL LOW (ref 13.0–17.7)
Immature Grans (Abs): 0 x10E3/uL (ref 0.0–0.1)
Immature Granulocytes: 0 %
Iron Saturation: 24 % (ref 15–55)
Iron: 96 ug/dL (ref 38–169)
Lymphocytes Absolute: 1.7 x10E3/uL (ref 0.7–3.1)
Lymphs: 29 %
MCH: 29.9 pg (ref 26.6–33.0)
MCHC: 31.8 g/dL (ref 31.5–35.7)
MCV: 94 fL (ref 79–97)
Monocytes Absolute: 0.7 x10E3/uL (ref 0.1–0.9)
Monocytes: 12 %
Neutrophils Absolute: 3.3 x10E3/uL (ref 1.4–7.0)
Neutrophils: 54 %
Platelets: 218 x10E3/uL (ref 150–450)
RBC: 4.25 x10E6/uL (ref 4.14–5.80)
RDW: 12.8 % (ref 11.6–15.4)
Retic Ct Pct: 1.3 % (ref 0.6–2.6)
Total Iron Binding Capacity: 403 ug/dL (ref 250–450)
UIBC: 307 ug/dL (ref 111–343)
Vitamin B-12: 1492 pg/mL — AB (ref 232–1245)
WBC: 6 x10E3/uL (ref 3.4–10.8)

## 2024-08-05 LAB — PSA: Prostate Specific Ag, Serum: 0.7 ng/mL (ref 0.0–4.0)

## 2024-08-05 LAB — TSH+FREE T4
Free T4: 1.07 ng/dL (ref 0.82–1.77)
TSH: 3.45 u[IU]/mL (ref 0.450–4.500)

## 2024-08-05 LAB — LIPID PANEL
Chol/HDL Ratio: 1.6 ratio (ref 0.0–5.0)
Cholesterol, Total: 186 mg/dL (ref 100–199)
HDL: 116 mg/dL (ref >39)
LDL Chol Calc (NIH): 62 mg/dL (ref 0–99)
Triglycerides: 39 mg/dL (ref 0–149)
VLDL Cholesterol Cal: 8 mg/dL (ref 5–40)

## 2024-08-05 NOTE — Telephone Encounter (Signed)
 Patient aware and verbalized understanding.

## 2024-08-07 DIAGNOSIS — K080 Exfoliation of teeth due to systemic causes: Secondary | ICD-10-CM | POA: Diagnosis not present

## 2024-08-07 LAB — H. PYLORI ANTIGEN, STOOL: H pylori Ag, Stl: NEGATIVE

## 2024-08-11 DIAGNOSIS — K080 Exfoliation of teeth due to systemic causes: Secondary | ICD-10-CM | POA: Diagnosis not present

## 2024-08-23 ENCOUNTER — Other Ambulatory Visit: Payer: Self-pay | Admitting: *Deleted

## 2024-08-23 DIAGNOSIS — I700 Atherosclerosis of aorta: Secondary | ICD-10-CM

## 2024-08-23 DIAGNOSIS — E7800 Pure hypercholesterolemia, unspecified: Secondary | ICD-10-CM

## 2024-09-03 DIAGNOSIS — K080 Exfoliation of teeth due to systemic causes: Secondary | ICD-10-CM | POA: Diagnosis not present

## 2025-05-31 ENCOUNTER — Ambulatory Visit: Payer: Self-pay
